# Patient Record
Sex: Female | Born: 2000 | Race: White | Hispanic: No | Marital: Single | Smoking: Former smoker
Health system: Southern US, Community
[De-identification: ages and names within clinical notes are randomized; demographics above are authoritative.]

## PROBLEM LIST (undated history)

## (undated) DIAGNOSIS — J45909 Unspecified asthma, uncomplicated: Secondary | ICD-10-CM

## (undated) HISTORY — PX: BREAST LUMPECTOMY: SHX2

## (undated) HISTORY — PX: WISDOM TOOTH EXTRACTION: SHX21

---

## 2018-02-28 ENCOUNTER — Ambulatory Visit (HOSPITAL_COMMUNITY)
Admission: EM | Admit: 2018-02-28 | Discharge: 2018-02-28 | Disposition: A | Discharge status: Home / Self Care | Payer: Managed Care, Other (non HMO) | Attending: Family Medicine | Admitting: Family Medicine

## 2018-02-28 ENCOUNTER — Other Ambulatory Visit: Payer: Self-pay

## 2018-02-28 ENCOUNTER — Ambulatory Visit (INDEPENDENT_AMBULATORY_CARE_PROVIDER_SITE_OTHER): Payer: Managed Care, Other (non HMO)

## 2018-02-28 ENCOUNTER — Encounter (HOSPITAL_COMMUNITY): Payer: Self-pay | Admitting: Family Medicine

## 2018-02-28 DIAGNOSIS — M25532 Pain in left wrist: Secondary | ICD-10-CM

## 2018-02-28 HISTORY — DX: Unspecified asthma, uncomplicated: J45.909

## 2018-02-28 NOTE — ED Triage Notes (Signed)
Pt states she had an IV placed in her left wrist one month ago.  Upon receiving medication for nausea she had pain and swelling above the IV site.  Pt states it took a few weeks to feel better. Then she started having more pain and swelling about a week ago.  She has a small nodule at the IV site and some swelling above the site.

## 2018-02-28 NOTE — Discharge Instructions (Addendum)
Your x-ray was negative. I would like for you to follow-up with a hand specialist for further evaluation. Tylenol or ibuprofen for pain. We can try a wrist splint to see if this helps. Make sure you ice the area

## 2018-02-28 NOTE — ED Provider Notes (Signed)
[facility]    CSN: [id number] Arrival date & time: 02/28/18  1551     History   Chief Complaint Chief Complaint  Patient presents with  . Arm Problem    left    HPI Angela Mckenzie is a 17 y.o. female.   Is a 17 year old female that presents with left wrist pain and swelling x1 month.  This started after she was hospitalized and had an IV in the left posterior wrist.  Reports that the medication that was pushed through the IV for nausea caused severe burning around the site where the IV was and felt like her arm was on fire.  Since she was discharged she has had a persistent nodule with swelling in the wrist extending up the arm.  Wrist is very tender to touch.  No erythema or increased warmth.   ROS per HPI      Past Medical History:  Diagnosis Date  . Asthma     There are no active problems to display for this patient.   Past Surgical History:  Procedure Laterality Date  . BREAST LUMPECTOMY    . WISDOM TOOTH EXTRACTION      OB History   None      Home Medications    Prior to Admission medications   Medication Sig Start Date End Date Taking? Authorizing Provider  JUNEL FE 1.5/30 1.5-30 MG-MCG tablet Take 1 tablet by mouth daily. 01/30/18  Yes [provider]  montelukast (SINGULAIR) 10 MG tablet  02/26/18  Yes [provider]  EPINEPHrine 0.3 mg/0.3 mL IJ SOAJ injection PLEASE SEE ATTACHED FOR DETAILED DIRECTIONS 01/31/18   [provider]  PROAIR HFA 108 [phone] Base) MCG/ACT inhaler  01/31/18   [provider]  valACYclovir (VALTREX) 500 MG tablet Take 500 mg by mouth daily. 12/05/17   [provider]    Family History History reviewed. No pertinent family history.  Social History Social History   Tobacco Use  . Smoking status: Current Every Day Smoker    Packs/day: 0.25    Types: Cigarettes  . Smokeless tobacco: Never Used  Substance Use Topics  . Alcohol use: Not Currently    Frequency: Never  .  Drug use: Yes    Types: Marijuana      Allergies   Patient has no known allergies.   Review of Systems Review of Systems  Constitutional: Negative for activity change, appetite change and fever.  Musculoskeletal: Positive for joint swelling.     Physical Exam Triage Vital Signs ED Triage Vitals  Enc Vitals Group     BP 02/28/18 1635 136/85     Pulse Rate 02/28/18 1635 76     Resp 02/28/18 1635 16     Temp 02/28/18 1635 98 F (36.7 C)     Temp Source 02/28/18 1635 Oral     SpO2 02/28/18 1635 98 %     Weight --      Height --      Head Circumference --      Peak Flow --      Pain Score 02/28/18 1652 7     Pain Loc --      Pain Edu? --      Excl. in GC? --    No data found.  Updated Vital Signs BP 136/85 (BP Location: Right Arm)   Pulse 76   Temp 98 F (36.7 C) (Oral)   Resp 16   LMP 01/31/2018 (Approximate)   SpO2 98%  Visual Acuity Right Eye Distance:   Left Eye Distance:   Bilateral Distance:    Right Eye Near:   Left Eye Near:    Bilateral Near:     Physical Exam  Constitutional: She is oriented to person, place, and time. She appears well-developed and well-nourished.  Very pleasant. Non toxic or ill appearing.     HENT:  Head: Normocephalic and atraumatic.  Eyes: Conjunctivae are normal.  Neck: Normal range of motion.  Pulmonary/Chest: Effort normal.  Musculoskeletal: Normal range of motion.  Tenderness to palpation and swelling to the left posterior forearm and wrist. Nodule palpated in center of left wrist.  No erythema or increased warmth. Sensation and pulse intact.   Neurological: She is alert and oriented to person, place, and time.  Skin: Skin is warm and dry. No rash noted. No erythema. No pallor.  Psychiatric: She has a normal mood and affect.  Nursing note and vitals reviewed.    UC Treatments / Results  Labs (all labs ordered are listed, but only abnormal results are displayed) Labs Reviewed - No data to  display  EKG None  Radiology Dg Wrist Complete Left  Result Date: 02/28/2018 CLINICAL DATA:  Wrist pain and swelling EXAM: LEFT WRIST - COMPLETE 3+ VIEW COMPARISON:  None. FINDINGS: No fracture or malalignment. No periostitis or bone destruction. No radiopaque foreign body in the soft tissues IMPRESSION: No acute osseous abnormality Electronically Signed   By: Jasmine Pang M.D.   On: 02/28/2018 17:52    Procedures Procedures (including critical care time)  Medications Ordered in UC Medications - No data to display  Initial Impression / Assessment and Plan / UC Course  I have reviewed the triage vital signs and the nursing notes.  Pertinent labs & imaging results that were available during my care of the patient were reviewed by me and considered in my medical decision making (see chart for details).     X ray negative for foreign body or bony abnormality.  Wrist splint, ice elevate and ibuprofen for the pain and inflammation.  Instructed to follow up with hand surgery for further evaluation in the next week if symptoms still persist.  Patient understanding and agreeable to plan Final Clinical Impressions(s) / UC Diagnoses   Final diagnoses:  Left wrist pain     Discharge Instructions     Your x-ray was negative. I would like for you to follow-up with a hand specialist for further evaluation. Tylenol or ibuprofen for pain. We can try a wrist splint to see if this helps. Make sure you ice the area    ED Prescriptions    None     Controlled Substance Prescriptions [place] Controlled Substance Registry consulted? Not Applicable   Janace Aris, NP 03/01/18 [phone]

## 2018-06-29 ENCOUNTER — Other Ambulatory Visit: Payer: Self-pay

## 2018-06-29 ENCOUNTER — Encounter (HOSPITAL_COMMUNITY): Payer: Self-pay | Admitting: Emergency Medicine

## 2018-06-29 ENCOUNTER — Emergency Department (HOSPITAL_COMMUNITY)
Admission: EM | Admit: 2018-06-29 | Discharge: 2018-06-30 | Disposition: A | Discharge status: Home / Self Care | Payer: Managed Care, Other (non HMO) | Attending: Emergency Medicine | Admitting: Emergency Medicine

## 2018-06-29 DIAGNOSIS — F17210 Nicotine dependence, cigarettes, uncomplicated: Secondary | ICD-10-CM | POA: Diagnosis not present

## 2018-06-29 DIAGNOSIS — Z79899 Other long term (current) drug therapy: Secondary | ICD-10-CM | POA: Diagnosis not present

## 2018-06-29 DIAGNOSIS — N72 Inflammatory disease of cervix uteri: Secondary | ICD-10-CM | POA: Insufficient documentation

## 2018-06-29 DIAGNOSIS — J45909 Unspecified asthma, uncomplicated: Secondary | ICD-10-CM | POA: Diagnosis not present

## 2018-06-29 DIAGNOSIS — R1030 Lower abdominal pain, unspecified: Secondary | ICD-10-CM | POA: Diagnosis present

## 2018-06-29 LAB — URINALYSIS, ROUTINE W REFLEX MICROSCOPIC
BILIRUBIN URINE: NEGATIVE
Glucose, UA: NEGATIVE mg/dL
HGB URINE DIPSTICK: NEGATIVE
Ketones, ur: 20 mg/dL — AB
NITRITE: NEGATIVE
Protein, ur: NEGATIVE mg/dL
SPECIFIC GRAVITY, URINE: 1.019 (ref 1.005–1.030)
pH: 6 (ref 5.0–8.0)

## 2018-06-29 LAB — CBC
HEMATOCRIT: 42.6 % (ref 36.0–46.0)
HEMOGLOBIN: 13.7 g/dL (ref 12.0–15.0)
MCH: 28.9 pg (ref 26.0–34.0)
MCHC: 32.2 g/dL (ref 30.0–36.0)
MCV: 89.9 fL (ref 80.0–100.0)
NRBC: 0 % (ref 0.0–0.2)
Platelets: 360 10*3/uL (ref 150–400)
RBC: 4.74 MIL/uL (ref 3.87–5.11)
RDW: 13 % (ref 11.5–15.5)
WBC: 12 10*3/uL — ABNORMAL HIGH (ref 4.0–10.5)

## 2018-06-29 LAB — COMPREHENSIVE METABOLIC PANEL
ALT: 16 U/L (ref 0–44)
ANION GAP: 11 (ref 5–15)
AST: 17 U/L (ref 15–41)
Albumin: 4.1 g/dL (ref 3.5–5.0)
Alkaline Phosphatase: 56 U/L (ref 38–126)
BUN: 9 mg/dL (ref 6–20)
CHLORIDE: 107 mmol/L (ref 98–111)
CO2: 22 mmol/L (ref 22–32)
Calcium: 9.5 mg/dL (ref 8.9–10.3)
Creatinine, Ser: 0.58 mg/dL (ref 0.44–1.00)
Glucose, Bld: 80 mg/dL (ref 70–99)
POTASSIUM: 4.1 mmol/L (ref 3.5–5.1)
Sodium: 140 mmol/L (ref 135–145)
Total Bilirubin: 0.7 mg/dL (ref 0.3–1.2)
Total Protein: 7.4 g/dL (ref 6.5–8.1)

## 2018-06-29 LAB — LIPASE, BLOOD: Lipase: 28 U/L (ref 11–51)

## 2018-06-29 LAB — I-STAT BETA HCG BLOOD, ED (MC, WL, AP ONLY)

## 2018-06-29 MED ORDER — ONDANSETRON 4 MG PO TBDP
4.0000 mg | ORAL_TABLET | Freq: Once | ORAL | Status: AC | PRN
  Administered 2018-06-29: 4 mg via ORAL
  Filled 2018-06-29: qty 1

## 2018-06-29 NOTE — ED Triage Notes (Signed)
Onset one day ago developed RLQ abdominal pain with nausea and emesis x 2 today. Seen at Urgent Care sent patient to ED for evaluation.

## 2018-06-30 LAB — WET PREP, GENITAL
Sperm: NONE SEEN
Trich, Wet Prep: NONE SEEN
Yeast Wet Prep HPF POC: NONE SEEN

## 2018-06-30 MED ORDER — CEFTRIAXONE SODIUM 250 MG IJ SOLR
250.0000 mg | Freq: Once | INTRAMUSCULAR | Status: AC
  Administered 2018-06-30: 250 mg via INTRAMUSCULAR
  Filled 2018-06-30: qty 250

## 2018-06-30 MED ORDER — KETOROLAC TROMETHAMINE 15 MG/ML IJ SOLN
15.0000 mg | Freq: Once | INTRAMUSCULAR | Status: AC
  Administered 2018-06-30: 15 mg via INTRAVENOUS
  Filled 2018-06-30: qty 1

## 2018-06-30 MED ORDER — LIDOCAINE HCL (PF) 1 % IJ SOLN
0.9000 mL | Freq: Once | INTRAMUSCULAR | Status: AC
  Administered 2018-06-30: 0.9 mL
  Filled 2018-06-30: qty 5

## 2018-06-30 MED ORDER — DOXYCYCLINE HYCLATE 100 MG PO CAPS: 100.0000 mg | ORAL_CAPSULE | Freq: Two times a day (BID) | ORAL | 0 refills | Status: DC

## 2018-06-30 MED ORDER — METRONIDAZOLE 500 MG PO TABS
2000.0000 mg | ORAL_TABLET | Freq: Once | ORAL | Status: AC
  Administered 2018-06-30: 2000 mg via ORAL
  Filled 2018-06-30: qty 4

## 2018-06-30 MED ORDER — ONDANSETRON 4 MG PO TBDP: 4.0000 mg | ORAL_TABLET | Freq: Three times a day (TID) | ORAL | 0 refills | Status: AC | PRN

## 2018-06-30 MED ORDER — ACETAMINOPHEN 500 MG PO TABS
1000.0000 mg | ORAL_TABLET | Freq: Once | ORAL | Status: AC
  Administered 2018-06-30: 1000 mg via ORAL
  Filled 2018-06-30: qty 2

## 2018-06-30 MED ORDER — AZITHROMYCIN 250 MG PO TABS
1000.0000 mg | ORAL_TABLET | Freq: Once | ORAL | Status: AC
  Administered 2018-06-30: 1000 mg via ORAL
  Filled 2018-06-30: qty 4

## 2018-06-30 NOTE — ED Provider Notes (Signed)
[facility] EMERGENCY DEPARTMENT Provider Note   CSN: [id number] Arrival date & time: 06/29/18  1944     History   Chief Complaint Chief Complaint  Patient presents with  . Abdominal Pain    HPI Angela Mckenzie is a 18 y.o. female.  18 yo F with a chief complaint of lower abdominal pain.  This been going on for the past couple days.  She describes it as sharp and severe has had some episodes of vomiting.  She denies vaginal bleeding or discharge denies trauma.  Denies fevers or chills.  She has pressure when she urinates.  Denies other urinary symptoms.  She missed her menstrual cycle last month and has taken 2 pregnancy test that were negative.  The history is provided by the patient.  Abdominal Pain  Pain location:  Suprapubic Pain quality: aching, cramping and sharp   Pain radiates to:  Does not radiate Pain severity:  Severe Onset quality:  Gradual Duration:  2 days Timing:  Constant Progression:  Worsening Chronicity:  New Relieved by:  NSAIDs Worsened by:  Nothing Ineffective treatments:  None tried Associated symptoms: vomiting   Associated symptoms: no chest pain, no chills, no dysuria, no fever, no nausea and no shortness of breath     Past Medical History:  Diagnosis Date  . Asthma     There are no active problems to display for this patient.   Past Surgical History:  Procedure Laterality Date  . BREAST LUMPECTOMY    . WISDOM TOOTH EXTRACTION       OB History   No obstetric history on file.      Home Medications    Prior to Admission medications   Medication Sig Start Date End Date Taking? Authorizing Provider  doxycycline (VIBRAMYCIN) 100 MG capsule Take 1 capsule (100 mg total) by mouth 2 (two) times daily. 06/30/18   [name], DO  EPINEPHrine 0.3 mg/0.3 mL IJ SOAJ injection PLEASE SEE ATTACHED FOR DETAILED DIRECTIONS 01/31/18   [provider]  JUNEL FE 1.5/30 1.5-30 MG-MCG tablet Take 1 tablet by mouth daily.  01/30/18   [provider]  montelukast (SINGULAIR) 10 MG tablet  02/26/18   [provider]  ondansetron (ZOFRAN ODT) 4 MG disintegrating tablet Take 1 tablet (4 mg total) by mouth every 8 (eight) hours as needed for nausea or vomiting. 06/30/18   [name], DO  PROAIR HFA 108 [phone] Base) MCG/ACT inhaler  01/31/18   [provider]  valACYclovir (VALTREX) 500 MG tablet Take 500 mg by mouth daily. 12/05/17   [provider]    Family History No family history on file.  Social History Social History   Tobacco Use  . Smoking status: Current Every Day Smoker    Packs/day: 0.25    Types: Cigarettes  . Smokeless tobacco: Never Used  Substance Use Topics  . Alcohol use: Not Currently    Frequency: Never  . Drug use: Yes    Types: Marijuana     Allergies   Patient has no known allergies.   Review of Systems Review of Systems  Constitutional: Negative for chills and fever.  HENT: Negative for congestion and rhinorrhea.   Eyes: Negative for redness and visual disturbance.  Respiratory: Negative for shortness of breath and wheezing.   Cardiovascular: Negative for chest pain and palpitations.  Gastrointestinal: Positive for abdominal pain and vomiting. Negative for nausea.  Genitourinary: Negative for dysuria and urgency.  Musculoskeletal: Negative for arthralgias and myalgias.  Skin: Negative for pallor and wound.  Neurological: Negative for dizziness and headaches.     Physical Exam Updated Vital Signs BP 130/78   Pulse 87   Temp 98.8 F (37.1 C) (Oral)   Resp 16   Ht 5\' 6"  (1.676 m)   Wt 61.2 kg   LMP 05/20/2018   SpO2 99%   BMI 21.79 kg/m   Physical Exam Vitals signs and nursing note reviewed.  Constitutional:      General: She is not in acute distress.    Appearance: She is well-developed. She is not diaphoretic.  HENT:     Head: Normocephalic and atraumatic.  Eyes:     Pupils: Pupils are equal, round, and reactive to light.    Neck:     Musculoskeletal: Normal range of motion and neck supple.  Cardiovascular:     Rate and Rhythm: Normal rate and regular rhythm.     Heart sounds: No murmur. No friction rub. No gallop.   Pulmonary:     Effort: Pulmonary effort is normal.     Breath sounds: No wheezing or rales.  Abdominal:     General: There is no distension.     Palpations: Abdomen is soft.     Tenderness: There is abdominal tenderness.     Comments: Diffuse tenderness to the very low abdomen.  Genitourinary:    Cervix: Cervical motion tenderness present.     Comments: No noted adnexal tenderness, purulent drainage with erythematous cervix. Musculoskeletal:        General: No tenderness.  Skin:    General: Skin is warm and dry.  Neurological:     Mental Status: She is alert and oriented to person, place, and time.  Psychiatric:        Behavior: Behavior normal.      ED Treatments / Results  Labs (all labs ordered are listed, but only abnormal results are displayed) Labs Reviewed  WET PREP, GENITAL - Abnormal; Notable for the following components:      Result Value   Clue Cells Wet Prep HPF POC PRESENT (*)    WBC, Wet Prep HPF POC MANY (*)    All other components within normal limits  CBC - Abnormal; Notable for the following components:   WBC 12.0 (*)    All other components within normal limits  URINALYSIS, ROUTINE W REFLEX MICROSCOPIC - Abnormal; Notable for the following components:   APPearance HAZY (*)    Ketones, ur 20 (*)    Leukocytes, UA TRACE (*)    Bacteria, UA RARE (*)    All other components within normal limits  LIPASE, BLOOD  COMPREHENSIVE METABOLIC PANEL  I-STAT BETA HCG BLOOD, ED ([facility], [facility], [facility] ONLY)  GC/CHLAMYDIA PROBE AMP ([place]) NOT AT [facility]    EKG None  Radiology No results found.  Procedures Procedures (including critical care time)  Medications Ordered in ED Medications  metroNIDAZOLE (FLAGYL) tablet 2,000 mg (has no administration in time range)   ondansetron (ZOFRAN-ODT) disintegrating tablet 4 mg (4 mg Oral Given 06/29/18 2028)  ketorolac (TORADOL) 15 MG/ML injection 15 mg (15 mg Intravenous Given 06/30/18 0138)  acetaminophen (TYLENOL) tablet 1,000 mg (1,000 mg Oral Given 06/30/18 0138)  cefTRIAXone (ROCEPHIN) injection 250 mg (250 mg Intramuscular Given 06/30/18 0318)  azithromycin (ZITHROMAX) tablet 1,000 mg (1,000 mg Oral Given 06/30/18 0318)  lidocaine (PF) (XYLOCAINE) 1 % injection 0.9 mL (0.9 mLs Other Given 06/30/18 0318)     Initial Impression / Assessment and Plan / ED  Course  I have reviewed the triage vital signs and the nursing notes.  Pertinent labs & imaging results that were available during my care of the patient were reviewed by me and considered in my medical decision making (see chart for details).     18 yo F with a chief complaint of abdominal pain.  Most likely this is pelvic in etiology based on the location of her pain on abdominal exam.  Will perform a pelvic.  Pelvic exam with CMT, purulent drainage.   We will treat as PID.  PCP follow-up.  3:38 AM:  I have discussed the diagnosis/risks/treatment options with the patient and believe the pt to be eligible for discharge home to follow-up with PCP, GYN. We also discussed returning to the ED immediately if new or worsening sx occur. We discussed the sx which are most concerning (e.g., sudden worsening pain, fever, inability to tolerate by mouth) that necessitate immediate return. Medications administered to the patient during their visit and any new prescriptions provided to the patient are listed below.  Medications given during this visit Medications  metroNIDAZOLE (FLAGYL) tablet 2,000 mg (has no administration in time range)  ondansetron (ZOFRAN-ODT) disintegrating tablet 4 mg (4 mg Oral Given 06/29/18 2028)  ketorolac (TORADOL) 15 MG/ML injection 15 mg (15 mg Intravenous Given 06/30/18 0138)  acetaminophen (TYLENOL) tablet 1,000 mg (1,000 mg Oral Given  06/30/18 0138)  cefTRIAXone (ROCEPHIN) injection 250 mg (250 mg Intramuscular Given 06/30/18 0318)  azithromycin (ZITHROMAX) tablet 1,000 mg (1,000 mg Oral Given 06/30/18 0318)  lidocaine (PF) (XYLOCAINE) 1 % injection 0.9 mL (0.9 mLs Other Given 06/30/18 0318)      The patient appears reasonably screen and/or stabilized for discharge and I doubt any other medical condition or other [facility] requiring further screening, evaluation, or treatment in the ED at this time prior to discharge.    Final Clinical Impressions(s) / ED Diagnoses   Final diagnoses:  Cervicitis    ED Discharge Orders         Ordered    doxycycline (VIBRAMYCIN) 100 MG capsule  2 times daily     06/30/18 0334    ondansetron (ZOFRAN ODT) 4 MG disintegrating tablet  Every 8 hours PRN     06/30/18 0335           Melene PlanFloyd, Pauleen Goleman, DO 06/30/18 [phone]

## 2018-06-30 NOTE — Discharge Instructions (Addendum)
No sexual activity until you have finished your antibiotics.  Return for fever, worsening abdominal pain.

## 2018-07-02 LAB — GC/CHLAMYDIA PROBE AMP (~~LOC~~) NOT AT ARMC
CHLAMYDIA, DNA PROBE: NEGATIVE
NEISSERIA GONORRHEA: NEGATIVE

## 2020-07-08 ENCOUNTER — Other Ambulatory Visit: Payer: Managed Care, Other (non HMO)

## 2020-07-08 DIAGNOSIS — Z20822 Contact with and (suspected) exposure to covid-19: Secondary | ICD-10-CM

## 2020-07-10 LAB — SARS-COV-2, NAA 2 DAY TAT

## 2020-07-10 LAB — NOVEL CORONAVIRUS, NAA: SARS-CoV-2, NAA: NOT DETECTED

## 2020-08-31 ENCOUNTER — Emergency Department
Admission: EM | Admit: 2020-08-31 | Discharge: 2020-08-31 | Disposition: A | Discharge status: Home / Self Care | Payer: Managed Care, Other (non HMO) | Attending: Emergency Medicine | Admitting: Emergency Medicine

## 2020-08-31 ENCOUNTER — Other Ambulatory Visit: Payer: Self-pay

## 2020-08-31 ENCOUNTER — Emergency Department: Payer: Managed Care, Other (non HMO)

## 2020-08-31 DIAGNOSIS — J45901 Unspecified asthma with (acute) exacerbation: Secondary | ICD-10-CM | POA: Diagnosis not present

## 2020-08-31 DIAGNOSIS — F17210 Nicotine dependence, cigarettes, uncomplicated: Secondary | ICD-10-CM | POA: Diagnosis not present

## 2020-08-31 DIAGNOSIS — Z79899 Other long term (current) drug therapy: Secondary | ICD-10-CM | POA: Diagnosis not present

## 2020-08-31 DIAGNOSIS — R0602 Shortness of breath: Secondary | ICD-10-CM | POA: Diagnosis present

## 2020-08-31 LAB — CBC
HCT: 41.8 % (ref 36.0–46.0)
Hemoglobin: 13.7 g/dL (ref 12.0–15.0)
MCH: 29 pg (ref 26.0–34.0)
MCHC: 32.8 g/dL (ref 30.0–36.0)
MCV: 88.6 fL (ref 80.0–100.0)
Platelets: 359 10*3/uL (ref 150–400)
RBC: 4.72 MIL/uL (ref 3.87–5.11)
RDW: 12.7 % (ref 11.5–15.5)
WBC: 12.2 10*3/uL — ABNORMAL HIGH (ref 4.0–10.5)
nRBC: 0 % (ref 0.0–0.2)

## 2020-08-31 LAB — TROPONIN I (HIGH SENSITIVITY): Troponin I (High Sensitivity): 2 ng/L (ref <18)

## 2020-08-31 LAB — COMPREHENSIVE METABOLIC PANEL
ALT: 14 U/L (ref 0–44)
AST: 19 U/L (ref 15–41)
Albumin: 4.3 g/dL (ref 3.5–5.0)
Alkaline Phosphatase: 47 U/L (ref 38–126)
Anion gap: 8 (ref 5–15)
BUN: 9 mg/dL (ref 6–20)
CO2: 21 mmol/L — ABNORMAL LOW (ref 22–32)
Calcium: 9 mg/dL (ref 8.9–10.3)
Chloride: 113 mmol/L — ABNORMAL HIGH (ref 98–111)
Creatinine, Ser: 0.54 mg/dL (ref 0.44–1.00)
GFR, Estimated: >60 mL/min (ref >60)
Glucose, Bld: 97 mg/dL (ref 70–99)
Potassium: 3.5 mmol/L (ref 3.5–5.1)
Sodium: 142 mmol/L (ref 135–145)
Total Bilirubin: 0.6 mg/dL (ref 0.3–1.2)
Total Protein: 7.7 g/dL (ref 6.5–8.1)

## 2020-08-31 MED ORDER — KETOROLAC TROMETHAMINE 30 MG/ML IJ SOLN
30.0000 mg | Freq: Once | INTRAMUSCULAR | Status: AC
  Administered 2020-08-31: 30 mg via INTRAVENOUS
  Filled 2020-08-31: qty 1

## 2020-08-31 MED ORDER — IPRATROPIUM-ALBUTEROL 0.5-2.5 (3) MG/3ML IN SOLN
RESPIRATORY_TRACT | Status: AC
  Filled 2020-08-31: qty 3

## 2020-08-31 MED ORDER — IPRATROPIUM-ALBUTEROL 0.5-2.5 (3) MG/3ML IN SOLN
3.0000 mL | Freq: Once | RESPIRATORY_TRACT | Status: AC
  Administered 2020-08-31: 3 mL via RESPIRATORY_TRACT
  Filled 2020-08-31: qty 3

## 2020-08-31 MED ORDER — IPRATROPIUM-ALBUTEROL 0.5-2.5 (3) MG/3ML IN SOLN: 3.0000 mL | Freq: Four times a day (QID) | RESPIRATORY_TRACT | 1 refills | Status: DC | PRN

## 2020-08-31 MED ORDER — PREDNISONE 50 MG PO TABS: 50.0000 mg | ORAL_TABLET | Freq: Every day | ORAL | 0 refills | Status: DC

## 2020-08-31 MED ORDER — IPRATROPIUM-ALBUTEROL 0.5-2.5 (3) MG/3ML IN SOLN
3.0000 mL | Freq: Once | RESPIRATORY_TRACT | Status: AC
  Administered 2020-08-31: 3 mL via RESPIRATORY_TRACT

## 2020-08-31 MED ORDER — METHYLPREDNISOLONE SODIUM SUCC 125 MG IJ SOLR
125.0000 mg | Freq: Once | INTRAMUSCULAR | Status: AC
  Administered 2020-08-31: 125 mg via INTRAVENOUS
  Filled 2020-08-31: qty 2

## 2020-08-31 NOTE — ED Provider Notes (Signed)
[facility] Emergency Department Provider Note   ____________________________________________    I have reviewed the triage vital signs and the nursing notes.   HISTORY  Chief Complaint Shortness of Breath     HPI Angela Mckenzie is a 20 y.o. female with history of asthma who presents with complaints of shortness of breath.  Patient reports breathing became much worse this morning she has tightness in her chest and also describes a mild headache.  She is having significant difficulty breathing, history limited by shortness of breath  Past Medical History:  Diagnosis Date  . Asthma     There are no problems to display for this patient.   Past Surgical History:  Procedure Laterality Date  . BREAST LUMPECTOMY    . WISDOM TOOTH EXTRACTION      Prior to Admission medications   Medication Sig Start Date End Date Taking? Authorizing Provider  ipratropium-albuterol (DUONEB) 0.5-2.5 (3) MG/3ML SOLN Take 3 mLs by nebulization every 6 (six) hours as needed. 08/31/20  Yes [name], MD  predniSONE (DELTASONE) 50 MG tablet Take 1 tablet (50 mg total) by mouth daily with breakfast. 08/31/20  Yes [name], MD  doxycycline (VIBRAMYCIN) 100 MG capsule Take 1 capsule (100 mg total) by mouth 2 (two) times daily. 06/30/18   [name], DO  EPINEPHrine 0.3 mg/0.3 mL IJ SOAJ injection PLEASE SEE ATTACHED FOR DETAILED DIRECTIONS 01/31/18   [provider]  JUNEL FE 1.5/30 1.5-30 MG-MCG tablet Take 1 tablet by mouth daily. 01/30/18   [provider]  montelukast (SINGULAIR) 10 MG tablet  02/26/18   [provider]  ondansetron (ZOFRAN ODT) 4 MG disintegrating tablet Take 1 tablet (4 mg total) by mouth every 8 (eight) hours as needed for nausea or vomiting. 06/30/18   [name], DO  PROAIR HFA 108 [phone] Base) MCG/ACT inhaler  01/31/18   [provider]  valACYclovir (VALTREX) 500 MG tablet Take 500 mg by mouth daily. 12/05/17    [provider]     Allergies Patient has no known allergies.  No family history on file.  Social History Social History   Tobacco Use  . Smoking status: Current Every Day Smoker    Packs/day: 0.25    Types: Cigarettes  . Smokeless tobacco: Never Used  Vaping Use  . Vaping Use: Some days  Substance Use Topics  . Alcohol use: Not Currently  . Drug use: Yes    Types: Marijuana    Review of Systems limited by shortness of breath  Constitutional: No reported fever Eyes: No discharge ENT: Throat swelling Cardiovascular: As above Respiratory: As above Gastrointestinal: no vomiting.   Genitourinary: No dysuria reported Musculoskeletal: Negative for calf pain Skin: Negative for rash. Neurological: Positive headache, no weakness    ____________________________________________   PHYSICAL EXAM:  VITAL SIGNS: ED Triage Vitals [08/31/20 0648]  Enc Vitals Group     BP (!) 143/102     Pulse Rate (!) 126     Resp (!) 22     Temp 98.3 F (36.8 C)     Temp Source Oral     SpO2 92 %     Weight 68 kg (150 lb)     Height 1.676 m (5\' 6" )     Head Circumference      Peak Flow      Pain Score      Pain Loc      Pain Edu?      Excl. in GC?  Constitutional: Alert and oriented.  Eyes: Conjunctivae are normal.  Head: Atraumatic. Nose: No congestion/rhinnorhea. Mouth/Throat: Mucous membranes are moist.   Neck:  Painless ROM Cardiovascular: Tachycardia, regular rhythm. Grossly normal heart sounds.  Good peripheral circulation. Respiratory: Increased respiratory effort with tachypnea, pursed lip breathing, diffuse wheezing but moderate airflow Gastrointestinal: Soft and nontender. No distention.    Musculoskeletal: No lower extremity tenderness nor edema.  Warm and well perfused Neurologic:  Normal speech and language. No gross focal neurologic deficits are appreciated.  Skin:  Skin is warm, dry and intact. No rash  noted.   ____________________________________________   LABS (all labs ordered are listed, but only abnormal results are displayed)  Labs Reviewed  CBC - Abnormal; Notable for the following components:      Result Value   WBC 12.2 (*)    All other components within normal limits  COMPREHENSIVE METABOLIC PANEL - Abnormal; Notable for the following components:   Chloride 113 (*)    CO2 21 (*)    All other components within normal limits  TROPONIN I (HIGH SENSITIVITY)   ____________________________________________  EKG  ED ECG REPORT I, [name], the attending physician, personally viewed and interpreted this ECG.  Date: 08/31/2020  Rhythm: Sinus tachycardia QRS Axis: normal Intervals: normal ST/T Wave abnormalities: normal Narrative Interpretation: no evidence of acute ischemia  ____________________________________________  RADIOLOGY  Chest x-ray viewed by me, no infiltrate or effusion ____________________________________________   PROCEDURES  Procedure(s) performed: No  Procedures   Critical Care performed: No ____________________________________________   INITIAL IMPRESSION / ASSESSMENT AND PLAN / ED COURSE  Pertinent labs & imaging results that were available during my care of the patient were reviewed by me and considered in my medical decision making (see chart for details).  Patient presents with shortness of breath, diffuse wheezing on exam, tachycardic with oxygen saturations in the low 90s.  Presentation consistent with asthma exacerbation.  Patient started on duo nebs immediately, IV Solu-Medrol ordered, cardiac monitor, EKG, labs ordered  Lab work with mildly elevated white blood cell count, related to work of breathing almost certainly.  ----------------------------------------- 7:53 AM on 08/31/2020 -----------------------------------------  Patient feeling much better, better airflow on exam, still some scattered wheezing, will give 1  additional DuoNeb  ----------------------------------------- 8:55 AM on 08/31/2020 -----------------------------------------  Wheezing has resolved, patient feels well and is asking to leave, appropriate for discharge at this time, strict return precautions discussed, medications refilled    ____________________________________________   FINAL CLINICAL IMPRESSION(S) / ED DIAGNOSES  Final diagnoses:  Exacerbation of asthma, unspecified asthma severity, unspecified whether persistent        Note:  This document was prepared using Dragon voice recognition software and may include unintentional dictation errors.   [name], MD 08/31/20 [phone]

## 2020-08-31 NOTE — ED Notes (Signed)
Esign not working pt verbalized discharge instructions and has no questions at this time 

## 2020-08-31 NOTE — ED Triage Notes (Addendum)
Pt states she is having an asthma attack. Pt tearful, states she is shob. Pt tool albuterol 10 min pta. Pursed lip breathing noted. Pt able to speak in short chopped sentences.

## 2020-08-31 NOTE — ED Notes (Signed)
X-ray at bedside

## 2020-09-07 ENCOUNTER — Emergency Department
Admission: EM | Admit: 2020-09-07 | Discharge: 2020-09-07 | Disposition: A | Discharge status: Home / Self Care | Payer: Managed Care, Other (non HMO) | Attending: Emergency Medicine | Admitting: Emergency Medicine

## 2020-09-07 ENCOUNTER — Emergency Department: Payer: Managed Care, Other (non HMO)

## 2020-09-07 ENCOUNTER — Other Ambulatory Visit: Payer: Self-pay

## 2020-09-07 DIAGNOSIS — J45901 Unspecified asthma with (acute) exacerbation: Secondary | ICD-10-CM | POA: Insufficient documentation

## 2020-09-07 DIAGNOSIS — F17210 Nicotine dependence, cigarettes, uncomplicated: Secondary | ICD-10-CM | POA: Insufficient documentation

## 2020-09-07 DIAGNOSIS — R0602 Shortness of breath: Secondary | ICD-10-CM | POA: Diagnosis present

## 2020-09-07 MED ORDER — IPRATROPIUM-ALBUTEROL 0.5-2.5 (3) MG/3ML IN SOLN
3.0000 mL | Freq: Once | RESPIRATORY_TRACT | Status: AC
  Administered 2020-09-07: 3 mL via RESPIRATORY_TRACT

## 2020-09-07 MED ORDER — IPRATROPIUM-ALBUTEROL 0.5-2.5 (3) MG/3ML IN SOLN
3.0000 mL | Freq: Once | RESPIRATORY_TRACT | Status: AC
  Administered 2020-09-07: 3 mL via RESPIRATORY_TRACT
  Filled 2020-09-07: qty 9

## 2020-09-07 MED ORDER — ALBUTEROL SULFATE (2.5 MG/3ML) 0.083% IN NEBU
2.5000 mg | INHALATION_SOLUTION | Freq: Four times a day (QID) | RESPIRATORY_TRACT | 12 refills | Status: DC | PRN
Start: 2020-09-07 — End: 2020-09-07

## 2020-09-07 MED ORDER — METHYLPREDNISOLONE SODIUM SUCC 125 MG IJ SOLR
125.0000 mg | Freq: Once | INTRAMUSCULAR | Status: AC
  Administered 2020-09-07: 125 mg via INTRAVENOUS
  Filled 2020-09-07: qty 2

## 2020-09-07 MED ORDER — KETOROLAC TROMETHAMINE 30 MG/ML IJ SOLN
30.0000 mg | Freq: Once | INTRAMUSCULAR | Status: AC
  Administered 2020-09-07: 30 mg via INTRAVENOUS
  Filled 2020-09-07: qty 1

## 2020-09-07 MED ORDER — ALBUTEROL SULFATE (2.5 MG/3ML) 0.083% IN NEBU
2.5000 mg | INHALATION_SOLUTION | Freq: Four times a day (QID) | RESPIRATORY_TRACT | 12 refills | Status: DC | PRN
Start: 2020-09-07 — End: 2023-06-22

## 2020-09-07 NOTE — ED Provider Notes (Signed)
[facility] Emergency Department Provider Note   ____________________________________________    I have reviewed the triage vital signs and the nursing notes.   HISTORY  Chief Complaint Asthma     HPI Angela Mckenzie is a 20 y.o. female with history of asthma who presents with complaints of shortness of breath.  Patient reports wheezing, apparently was unable to fill DuoNeb prescription provided last week.  She only took 1 prednisone because of difficulties obtaining prescription.  She complains of tightness in her chest, difficulty breathing, particularly bad this morning.  No fevers or chills.  Past Medical History:  Diagnosis Date  . Asthma     There are no problems to display for this patient.   Past Surgical History:  Procedure Laterality Date  . BREAST LUMPECTOMY    . WISDOM TOOTH EXTRACTION      Prior to Admission medications   Medication Sig Start Date End Date Taking? Authorizing Provider  albuterol (PROVENTIL) (2.5 MG/3ML) 0.083% nebulizer solution Take 3 mLs (2.5 mg total) by nebulization every 6 (six) hours as needed for wheezing or shortness of breath. 09/07/20  Yes [name], MD  doxycycline (VIBRAMYCIN) 100 MG capsule Take 1 capsule (100 mg total) by mouth 2 (two) times daily. 06/30/18   [name], DO  EPINEPHrine 0.3 mg/0.3 mL IJ SOAJ injection PLEASE SEE ATTACHED FOR DETAILED DIRECTIONS 01/31/18   [provider]  ipratropium-albuterol (DUONEB) 0.5-2.5 (3) MG/3ML SOLN Take 3 mLs by nebulization every 6 (six) hours as needed. 08/31/20   [name], MD  JUNEL FE 1.5/30 1.5-30 MG-MCG tablet Take 1 tablet by mouth daily. 01/30/18   [provider]  montelukast (SINGULAIR) 10 MG tablet  02/26/18   [provider]  ondansetron (ZOFRAN ODT) 4 MG disintegrating tablet Take 1 tablet (4 mg total) by mouth every 8 (eight) hours as needed for nausea or vomiting. 06/30/18   [name], DO  predniSONE (DELTASONE) 50  MG tablet Take 1 tablet (50 mg total) by mouth daily with breakfast. 08/31/20   [name], MD  valACYclovir (VALTREX) 500 MG tablet Take 500 mg by mouth daily. 12/05/17   [provider]     Allergies Patient has no known allergies.  No family history on file.  Social History Social History   Tobacco Use  . Smoking status: Current Every Day Smoker    Packs/day: 0.25    Types: Cigarettes  . Smokeless tobacco: Never Used  Vaping Use  . Vaping Use: Some days  Substance Use Topics  . Alcohol use: Not Currently  . Drug use: Yes    Types: Marijuana    Review of Systems  Constitutional: No fever/chills Eyes: No visual changes.  ENT: No sore throat. Cardiovascular: Chest tightness Respiratory: Positive shortness of breath Gastrointestinal: No abdominal pain.  No nausea, no vomiting.   Genitourinary: Negative for dysuria. Musculoskeletal: Negative for back pain. Skin: Negative for rash. Neurological: Mild headache   ____________________________________________   PHYSICAL EXAM:  VITAL SIGNS: ED Triage Vitals  Enc Vitals Group     BP 09/07/20 0724 120/75     Pulse Rate 09/07/20 0724 (!) 135     Resp 09/07/20 0724 20     Temp 09/07/20 0724 98.2 F (36.8 C)     Temp Source 09/07/20 0724 Oral     SpO2 09/07/20 0724 96 %     Weight 09/07/20 0727 68 kg (150 lb)     Height 09/07/20 0727 1.676 m (5\' 6" )  Head Circumference --      Peak Flow --      Pain Score 09/07/20 0727 7     Pain Loc --      Pain Edu? --      Excl. in GC? --     Constitutional: Alert and oriented.   Nose: No congestion/rhinnorhea. Mouth/Throat: Mucous membranes are moist.   Neck:  Painless ROM Cardiovascular: Tachycardia. Grossly normal heart sounds.  Good peripheral circulation. Respiratory: Increased respiratory effort with tachypnea, diffuse wheezing Gastrointestinal: Soft and nontender. No distention.  No CVA tenderness.  Musculoskeletal: No lower extremity tenderness nor  edema.  Warm and well perfused Neurologic:  Normal speech and language. No gross focal neurologic deficits are appreciated.  Skin:  Skin is warm, dry and intact. No rash noted. Psychiatric: Mood and affect are normal. Speech and behavior are normal.  ____________________________________________   LABS (all labs ordered are listed, but only abnormal results are displayed)  Labs Reviewed - No data to display ____________________________________________  EKG  ED ECG REPORT I, [name], the attending physician, personally viewed and interpreted this ECG.  Date: 09/07/2020  Rhythm: Sinus tachycardia QRS Axis: normal Intervals: normal ST/T Wave abnormalities: normal Narrative Interpretation: no evidence of acute ischemia  ____________________________________________  RADIOLOGY  Chest x-ray viewed by me, no infiltrate or effusion ____________________________________________   PROCEDURES  Procedure(s) performed: No  Procedures   Critical Care performed: No ____________________________________________   INITIAL IMPRESSION / ASSESSMENT AND PLAN / ED COURSE  Pertinent labs & imaging results that were available during my care of the patient were reviewed by me and considered in my medical decision making (see chart for details).  Patient presents with shortness of breath as noted above, wheezing on exam consistent with asthma exacerbation.  Had good response to DuoNeb's and Solu-Medrol last week, will repeat these medications, placed on the cardiac monitor.   ----------------------------------------- 9:37 AM on 09/07/2020 -----------------------------------------  Patient is feeling much better after treatments, on reexam, no wheezing, strongly encouraged her to take the prednisone as prescribed, albuterol nebulizers prescribed, strict return precautions, outpatient follow-up with PCP for long-term management recommended.     ____________________________________________   FINAL CLINICAL IMPRESSION(S) / ED DIAGNOSES  Final diagnoses:  Moderate asthma with exacerbation, unspecified whether persistent        Note:  This document was prepared using Dragon voice recognition software and may include unintentional dictation errors.   [name], MD 09/07/20 [phone]

## 2020-09-07 NOTE — ED Notes (Signed)
Pt comes with c/o asthma attack. Pt was seen here last week for same. Pt states no relief with inhalers. Pt has labored breathing and appears in distress.

## 2020-09-07 NOTE — ED Notes (Signed)
NAD noted at time of D/C. Pt ambulatory to the lobby at this time. Pt denies comments/concerns regarding D/C instructions. Verbal consent for D/C obtained at this time.

## 2020-09-07 NOTE — ED Triage Notes (Signed)
Pt states she has been having issues with asthma flare up over the past couple of weeks, was seen and treated for the same last week, has a prescription for neb but is unable to get it filled yet due to some issue with processing it. Pt is tachypnic on arrival; with pursed breathing.

## 2020-09-15 ENCOUNTER — Encounter: Payer: Self-pay | Admitting: Radiology

## 2020-09-15 ENCOUNTER — Other Ambulatory Visit: Payer: Self-pay

## 2020-09-15 ENCOUNTER — Inpatient Hospital Stay
Admission: EM | Admit: 2020-09-15 | Discharge: 2020-09-16 | DRG: 202 | Disposition: A | Discharge status: Home / Self Care | Payer: Managed Care, Other (non HMO) | Attending: Family Medicine | Admitting: Family Medicine

## 2020-09-15 ENCOUNTER — Emergency Department: Payer: Managed Care, Other (non HMO)

## 2020-09-15 DIAGNOSIS — Z8249 Family history of ischemic heart disease and other diseases of the circulatory system: Secondary | ICD-10-CM

## 2020-09-15 DIAGNOSIS — F17210 Nicotine dependence, cigarettes, uncomplicated: Secondary | ICD-10-CM | POA: Diagnosis present

## 2020-09-15 DIAGNOSIS — D72829 Elevated white blood cell count, unspecified: Secondary | ICD-10-CM | POA: Diagnosis present

## 2020-09-15 DIAGNOSIS — F410 Panic disorder [episodic paroxysmal anxiety] without agoraphobia: Secondary | ICD-10-CM | POA: Diagnosis present

## 2020-09-15 DIAGNOSIS — T380X5A Adverse effect of glucocorticoids and synthetic analogues, initial encounter: Secondary | ICD-10-CM | POA: Diagnosis present

## 2020-09-15 DIAGNOSIS — J9601 Acute respiratory failure with hypoxia: Secondary | ICD-10-CM | POA: Diagnosis present

## 2020-09-15 DIAGNOSIS — Z20822 Contact with and (suspected) exposure to covid-19: Secondary | ICD-10-CM | POA: Diagnosis present

## 2020-09-15 DIAGNOSIS — Z833 Family history of diabetes mellitus: Secondary | ICD-10-CM

## 2020-09-15 DIAGNOSIS — J4552 Severe persistent asthma with status asthmaticus: Secondary | ICD-10-CM | POA: Diagnosis present

## 2020-09-15 DIAGNOSIS — Z888 Allergy status to other drugs, medicaments and biological substances status: Secondary | ICD-10-CM | POA: Diagnosis not present

## 2020-09-15 DIAGNOSIS — R0602 Shortness of breath: Secondary | ICD-10-CM | POA: Diagnosis present

## 2020-09-15 DIAGNOSIS — J209 Acute bronchitis, unspecified: Secondary | ICD-10-CM | POA: Diagnosis present

## 2020-09-15 DIAGNOSIS — J45901 Unspecified asthma with (acute) exacerbation: Secondary | ICD-10-CM | POA: Diagnosis present

## 2020-09-15 LAB — BASIC METABOLIC PANEL
Anion gap: 7 (ref 5–15)
Anion gap: 10 (ref 5–15)
BUN: 16 mg/dL (ref 6–20)
BUN: 13 mg/dL (ref 6–20)
CO2: 27 mmol/L (ref 22–32)
CO2: 22 mmol/L (ref 22–32)
Calcium: 9.1 mg/dL (ref 8.9–10.3)
Calcium: 9 mg/dL (ref 8.9–10.3)
Chloride: 104 mmol/L (ref 98–111)
Chloride: 106 mmol/L (ref 98–111)
Creatinine, Ser: 0.63 mg/dL (ref 0.44–1.00)
Creatinine, Ser: 0.61 mg/dL (ref 0.44–1.00)
GFR, Estimated: >60 mL/min (ref >60)
GFR, Estimated: >60 mL/min (ref >60)
Glucose, Bld: 115 mg/dL — ABNORMAL HIGH (ref 70–99)
Glucose, Bld: 188 mg/dL — ABNORMAL HIGH (ref 70–99)
Potassium: 4 mmol/L (ref 3.5–5.1)
Potassium: 3.4 mmol/L — ABNORMAL LOW (ref 3.5–5.1)
Sodium: 138 mmol/L (ref 135–145)
Sodium: 138 mmol/L (ref 135–145)

## 2020-09-15 LAB — CBC
HCT: 40.6 % (ref 36.0–46.0)
Hemoglobin: 13.4 g/dL (ref 12.0–15.0)
MCH: 29.1 pg (ref 26.0–34.0)
MCHC: 33 g/dL (ref 30.0–36.0)
MCV: 88.3 fL (ref 80.0–100.0)
Platelets: 298 10*3/uL (ref 150–400)
RBC: 4.6 MIL/uL (ref 3.87–5.11)
RDW: 12.8 % (ref 11.5–15.5)
WBC: 21.9 10*3/uL — ABNORMAL HIGH (ref 4.0–10.5)
nRBC: 0 % (ref 0.0–0.2)

## 2020-09-15 LAB — URINE DRUG SCREEN, QUALITATIVE (ARMC ONLY)
Amphetamines, Ur Screen: NOT DETECTED
Barbiturates, Ur Screen: NOT DETECTED
Benzodiazepine, Ur Scrn: NOT DETECTED
Cannabinoid 50 Ng, Ur ~~LOC~~: POSITIVE — AB
Cocaine Metabolite,Ur ~~LOC~~: NOT DETECTED
MDMA (Ecstasy)Ur Screen: NOT DETECTED
Methadone Scn, Ur: NOT DETECTED
Opiate, Ur Screen: POSITIVE — AB
Phencyclidine (PCP) Ur S: NOT DETECTED
Tricyclic, Ur Screen: NOT DETECTED

## 2020-09-15 LAB — CBC WITH DIFFERENTIAL/PLATELET
Abs Immature Granulocytes: 0.23 10*3/uL — ABNORMAL HIGH (ref 0.00–0.07)
Basophils Absolute: 0.1 10*3/uL (ref 0.0–0.1)
Basophils Relative: 1 %
Eosinophils Absolute: 1.6 10*3/uL — ABNORMAL HIGH (ref 0.0–0.5)
Eosinophils Relative: 10 %
HCT: 43.8 % (ref 36.0–46.0)
Hemoglobin: 14 g/dL (ref 12.0–15.0)
Immature Granulocytes: 1 %
Lymphocytes Relative: 34 %
Lymphs Abs: 5.7 10*3/uL — ABNORMAL HIGH (ref 0.7–4.0)
MCH: 28.7 pg (ref 26.0–34.0)
MCHC: 32 g/dL (ref 30.0–36.0)
MCV: 89.8 fL (ref 80.0–100.0)
Monocytes Absolute: 0.9 10*3/uL (ref 0.1–1.0)
Monocytes Relative: 5 %
Neutro Abs: 8.1 10*3/uL — ABNORMAL HIGH (ref 1.7–7.7)
Neutrophils Relative %: 49 %
Platelets: 354 10*3/uL (ref 150–400)
RBC: 4.88 MIL/uL (ref 3.87–5.11)
RDW: 12.8 % (ref 11.5–15.5)
Smear Review: NORMAL
WBC: 16.7 10*3/uL — ABNORMAL HIGH (ref 4.0–10.5)
nRBC: 0 % (ref 0.0–0.2)

## 2020-09-15 LAB — RESP PANEL BY RT-PCR (FLU A&B, COVID) ARPGX2
Influenza A by PCR: NEGATIVE
Influenza B by PCR: NEGATIVE
SARS Coronavirus 2 by RT PCR: NEGATIVE

## 2020-09-15 LAB — HCG, QUANTITATIVE, PREGNANCY: hCG, Beta Chain, Quant, S: <1 m[IU]/mL (ref <5)

## 2020-09-15 LAB — HIV ANTIBODY (ROUTINE TESTING W REFLEX): HIV Screen 4th Generation wRfx: NONREACTIVE

## 2020-09-15 LAB — MAGNESIUM: Magnesium: 2.3 mg/dL (ref 1.7–2.4)

## 2020-09-15 MED ORDER — MORPHINE SULFATE (PF) 2 MG/ML IV SOLN
1.0000 mg | INTRAVENOUS | Status: DC | PRN
  Administered 2020-09-15 – 2020-09-16 (×5): 1 mg via INTRAVENOUS
  Filled 2020-09-15 (×5): qty 1

## 2020-09-15 MED ORDER — MAGNESIUM HYDROXIDE 400 MG/5ML PO SUSP
30.0000 mL | Freq: Every day | ORAL | Status: DC | PRN
  Filled 2020-09-15: qty 30

## 2020-09-15 MED ORDER — ONDANSETRON 4 MG PO TBDP
4.0000 mg | ORAL_TABLET | Freq: Three times a day (TID) | ORAL | Status: DC | PRN
  Filled 2020-09-15: qty 1

## 2020-09-15 MED ORDER — MAGNESIUM SULFATE 2 GM/50ML IV SOLN
2.0000 g | Freq: Once | INTRAVENOUS | Status: AC
  Administered 2020-09-15: 2 g via INTRAVENOUS

## 2020-09-15 MED ORDER — ENOXAPARIN SODIUM 40 MG/0.4ML ~~LOC~~ SOLN
40.0000 mg | SUBCUTANEOUS | Status: DC
  Administered 2020-09-15 – 2020-09-16 (×2): 40 mg via SUBCUTANEOUS
  Filled 2020-09-15 (×2): qty 0.4

## 2020-09-15 MED ORDER — ACETAMINOPHEN 650 MG RE SUPP: 650.0000 mg | Freq: Four times a day (QID) | RECTAL | Status: DC | PRN

## 2020-09-15 MED ORDER — DOXYCYCLINE HYCLATE 100 MG PO TABS
100.0000 mg | ORAL_TABLET | Freq: Two times a day (BID) | ORAL | Status: DC
  Administered 2020-09-15 – 2020-09-16 (×3): 100 mg via ORAL
  Filled 2020-09-15 (×3): qty 1

## 2020-09-15 MED ORDER — LORATADINE 10 MG PO TABS
10.0000 mg | ORAL_TABLET | ORAL | Status: AC
  Administered 2020-09-15: 10 mg via ORAL

## 2020-09-15 MED ORDER — IPRATROPIUM-ALBUTEROL 0.5-2.5 (3) MG/3ML IN SOLN
3.0000 mL | Freq: Once | RESPIRATORY_TRACT | Status: AC
  Administered 2020-09-15: 3 mL via RESPIRATORY_TRACT

## 2020-09-15 MED ORDER — TRAZODONE HCL 50 MG PO TABS
25.0000 mg | ORAL_TABLET | Freq: Every evening | ORAL | Status: DC | PRN
  Administered 2020-09-16: 25 mg via ORAL
  Filled 2020-09-15: qty 1

## 2020-09-15 MED ORDER — ONDANSETRON HCL 4 MG/2ML IJ SOLN: 4.0000 mg | Freq: Four times a day (QID) | INTRAMUSCULAR | Status: DC | PRN

## 2020-09-15 MED ORDER — ALBUTEROL (5 MG/ML) CONTINUOUS INHALATION SOLN
10.0000 mg/h | INHALATION_SOLUTION | Freq: Once | RESPIRATORY_TRACT | Status: AC
  Administered 2020-09-15: 10 mg/h via RESPIRATORY_TRACT
  Filled 2020-09-15: qty 20

## 2020-09-15 MED ORDER — GUAIFENESIN ER 600 MG PO TB12
600.0000 mg | ORAL_TABLET | Freq: Two times a day (BID) | ORAL | Status: DC
  Administered 2020-09-15 – 2020-09-16 (×3): 600 mg via ORAL
  Filled 2020-09-15 (×3): qty 1

## 2020-09-15 MED ORDER — ONDANSETRON HCL 4 MG/2ML IJ SOLN
4.0000 mg | INTRAMUSCULAR | Status: AC
  Administered 2020-09-15: 4 mg via INTRAVENOUS
  Filled 2020-09-15: qty 2

## 2020-09-15 MED ORDER — LACTATED RINGERS IV BOLUS
1000.0000 mL | Freq: Once | INTRAVENOUS | Status: AC
  Administered 2020-09-15: 1000 mL via INTRAVENOUS

## 2020-09-15 MED ORDER — ONDANSETRON HCL 4 MG PO TABS: 4.0000 mg | ORAL_TABLET | Freq: Four times a day (QID) | ORAL | Status: DC | PRN

## 2020-09-15 MED ORDER — MORPHINE SULFATE (PF) 4 MG/ML IV SOLN
4.0000 mg | Freq: Once | INTRAVENOUS | Status: AC
Start: 2020-09-15 — End: 2020-09-15
  Administered 2020-09-15: 4 mg via INTRAVENOUS
  Filled 2020-09-15: qty 1

## 2020-09-15 MED ORDER — METHYLPREDNISOLONE SODIUM SUCC 40 MG IJ SOLR
40.0000 mg | Freq: Four times a day (QID) | INTRAMUSCULAR | Status: AC
  Administered 2020-09-15 (×3): 40 mg via INTRAVENOUS
  Filled 2020-09-15 (×3): qty 1

## 2020-09-15 MED ORDER — ACETAMINOPHEN 325 MG PO TABS
650.0000 mg | ORAL_TABLET | Freq: Four times a day (QID) | ORAL | Status: DC | PRN
  Administered 2020-09-15 – 2020-09-16 (×3): 650 mg via ORAL
  Filled 2020-09-15 (×3): qty 2

## 2020-09-15 MED ORDER — POTASSIUM CHLORIDE CRYS ER 20 MEQ PO TBCR
40.0000 meq | EXTENDED_RELEASE_TABLET | Freq: Once | ORAL | Status: AC
  Administered 2020-09-15: 40 meq via ORAL
  Filled 2020-09-15: qty 2

## 2020-09-15 MED ORDER — DM-GUAIFENESIN ER 30-600 MG PO TB12: 1.0000 | ORAL_TABLET | Freq: Two times a day (BID) | ORAL | Status: DC | PRN

## 2020-09-15 MED ORDER — EPINEPHRINE 0.3 MG/0.3ML IJ SOAJ
0.3000 mg | Freq: Once | INTRAMUSCULAR | Status: DC | PRN
  Filled 2020-09-15: qty 0.3

## 2020-09-15 MED ORDER — NORETHIN ACE-ETH ESTRAD-FE 1.5-30 MG-MCG PO TABS
1.0000 | ORAL_TABLET | Freq: Every day | ORAL | Status: DC
Start: 2020-09-15 — End: 2020-09-16

## 2020-09-15 MED ORDER — PREDNISONE 20 MG PO TABS
40.0000 mg | ORAL_TABLET | Freq: Every day | ORAL | Status: DC
  Administered 2020-09-16: 40 mg via ORAL
  Filled 2020-09-15: qty 2

## 2020-09-15 MED ORDER — IPRATROPIUM-ALBUTEROL 0.5-2.5 (3) MG/3ML IN SOLN
3.0000 mL | Freq: Four times a day (QID) | RESPIRATORY_TRACT | Status: DC
  Administered 2020-09-15 – 2020-09-16 (×5): 3 mL via RESPIRATORY_TRACT
  Filled 2020-09-15 (×6): qty 3

## 2020-09-15 MED ORDER — MONTELUKAST SODIUM 10 MG PO TABS
10.0000 mg | ORAL_TABLET | Freq: Every day | ORAL | Status: DC
  Administered 2020-09-15: 20:00:00 10 mg via ORAL
  Filled 2020-09-15 (×2): qty 1

## 2020-09-15 MED ORDER — METHYLPREDNISOLONE SODIUM SUCC 125 MG IJ SOLR
125.0000 mg | Freq: Once | INTRAMUSCULAR | Status: AC
  Administered 2020-09-15: 125 mg via INTRAVENOUS

## 2020-09-15 MED ORDER — SODIUM CHLORIDE 0.9 % IV SOLN: INTRAVENOUS | Status: DC

## 2020-09-15 NOTE — ED Notes (Signed)
Pt now receiving duoneb x3 respiratory treatment-- xray now at bedside to complete [facility]

## 2020-09-15 NOTE — H&P (Signed)
[facility]   Angela Mckenzie NAME: Angela Mckenzie    MR#:  [id number]  DATE OF BIRTH:  [birth year]  DATE OF ADMISSION:  09/15/2020  PRIMARY CARE PHYSICIAN: Angela Mckenzie, No Pcp Per   Angela Mckenzie is coming from: Home  REQUESTING/REFERRING PHYSICIAN: Loleta Rose, MD  CHIEF COMPLAINT:   Chief Complaint  Angela Mckenzie presents with  . Shortness of Breath    HISTORY OF PRESENT ILLNESS:  Angela Mckenzie is a 20 y.o. Caucasian female with medical history significant for asthma, who presented to the emergency room with a [place] of acute asthma attack with dyspnea as well as cough productive of yellowish-white sputum and wheezing since last night.  She denies any fever or chills.  No nausea or vomiting or abdominal pain.  She took 3 nebulized albuterol treatments at home without help.  She admits to chest pain with deep breathing.  No rhinorrhea or nasal congestion or sore throat or earache.  No dysuria, oliguria or hematuria or flank pain.  This is the Angela Mckenzie's third ER visit here over the last couple weeks for acute asthma exacerbation. ED Course: When she came to the ER blood pressure was 128/105 with a heart rate of 145 and respiratory test 15 with a pulse oximetry of 96 and later 99% on 3 L of O2 by nasal cannula EKG as reviewed by me : showedsinus tachycardia with a rate of 142 with left atrial enlargement possibly biatrial enlargement  Imaging: Chest x-ray showed no acute cardiopulmonary disease.  The Angela Mckenzie was given 3 DuoNeb's and later continuous albuterol treatment, 1 L bolus of IV lactated Ringer, 125 mg IV Solu-Medrol, 2 g of IV magnesium sulfate and 10 mg p.o. Claritin, 4 mg of IV morphine sulfate and 4 mg of IV Zofran.  She will be admitted to a medical monitored bed for further evaluation and management. PAST MEDICAL HISTORY:   Past Medical History:  Diagnosis Date  . Asthma     PAST SURGICAL HISTORY:   Past Surgical History:  Procedure Laterality Date  . BREAST LUMPECTOMY    .  WISDOM TOOTH EXTRACTION      SOCIAL HISTORY:   Social History   Tobacco Use  . Smoking status: Current Every Day Smoker    Packs/day: 0.25    Types: Cigarettes  . Smokeless tobacco: Never Used  Substance Use Topics  . Alcohol use: Not Currently    FAMILY HISTORY:  Positive for diabetes mellitus, hypertension and dyslipidemia. DRUG ALLERGIES:  No Known Allergies  REVIEW OF SYSTEMS:   ROS As per history of present illness. All pertinent systems were reviewed above. Constitutional, HEENT, cardiovascular, respiratory, GI, GU, musculoskeletal, neuro, psychiatric, endocrine, integumentary and hematologic systems were reviewed and are otherwise negative/unremarkable except for positive findings mentioned above in the HPI.   MEDICATIONS AT HOME:   Prior to Admission medications   Medication Sig Start Date End Date Taking? Authorizing Provider  albuterol (PROVENTIL) (2.5 MG/3ML) 0.083% nebulizer solution Take 3 mLs (2.5 mg total) by nebulization every 6 (six) hours as needed for wheezing or shortness of breath. 09/07/20   [name], MD  doxycycline (VIBRAMYCIN) 100 MG capsule Take 1 capsule (100 mg total) by mouth 2 (two) times daily. 06/30/18   [name], DO  EPINEPHrine 0.3 mg/0.3 mL IJ SOAJ injection PLEASE SEE ATTACHED FOR DETAILED DIRECTIONS 01/31/18   [provider]  ipratropium-albuterol (DUONEB) 0.5-2.5 (3) MG/3ML SOLN Take 3 mLs by nebulization every 6 (six) hours as needed. 08/31/20   [name],  MD  JUNEL FE 1.5/30 1.5-30 MG-MCG tablet Take 1 tablet by mouth daily. 01/30/18   [provider]  montelukast (SINGULAIR) 10 MG tablet  02/26/18   [provider]  ondansetron (ZOFRAN ODT) 4 MG disintegrating tablet Take 1 tablet (4 mg total) by mouth every 8 (eight) hours as needed for nausea or vomiting. 06/30/18   [name], DO  predniSONE (DELTASONE) 50 MG tablet Take 1 tablet (50 mg total) by mouth daily with breakfast. 08/31/20   [name], MD   valACYclovir (VALTREX) 500 MG tablet Take 500 mg by mouth daily. 12/05/17   [provider]      VITAL SIGNS:  Blood pressure (!) 128/105, pulse (!) 145, temperature 98.3 F (36.8 C), height 5\' 6"  (1.676 m), weight 69.4 kg, last menstrual period 09/08/2020, SpO2 96 %.  PHYSICAL EXAMINATION:  Physical Exam  GENERAL:  20 y.o.-year-old Caucasian female Angela Mckenzie lying in the bed with mild to moderate respiratory distress with conversational dyspnea. EYES: Pupils equal, round, reactive to light and accommodation. No scleral icterus. Extraocular muscles intact.  HEENT: Head atraumatic, normocephalic. Oropharynx and nasopharynx clear.  NECK:  Supple, no jugular venous distention. No thyroid enlargement, no tenderness.  LUNGS: Diffuse expiratory wheezes with tight expiratory airflow and harsh vesicular breathing.  CARDIOVASCULAR: Regular rate and rhythm, S1, S2 normal. No murmurs, rubs, or gallops.  ABDOMEN: Soft, nondistended, nontender. Bowel sounds present. No organomegaly or mass.  EXTREMITIES: No pedal edema, cyanosis, or clubbing.  NEUROLOGIC: Cranial nerves II through XII are intact. Muscle strength 5/5 in all extremities. Sensation intact. Gait not checked.  PSYCHIATRIC: The Angela Mckenzie is alert and oriented x 3.  Normal affect and good eye contact. SKIN: No obvious rash, lesion, or ulcer.   LABORATORY PANEL:   CBC Recent Labs  Lab 09/15/20 0231  WBC 16.7*  HGB 14.0  HCT 43.8  PLT 354   ------------------------------------------------------------------------------------------------------------------  Chemistries  Recent Labs  Lab 09/15/20 0231  NA 138  K 4.0  CL 104  CO2 27  GLUCOSE 115*  BUN 16  CREATININE 0.63  CALCIUM 9.1  MG 2.3   ------------------------------------------------------------------------------------------------------------------  Cardiac Enzymes No results for input(s): TROPONINI in the last 168  hours. ------------------------------------------------------------------------------------------------------------------  RADIOLOGY:  DG Chest Portable 1 View  Result Date: 09/15/2020 CLINICAL DATA:  Dyspnea EXAM: PORTABLE CHEST 1 VIEW COMPARISON:  09/07/2020 FINDINGS: The heart size and mediastinal contours are within normal limits. Both lungs are clear. The visualized skeletal structures are unremarkable. IMPRESSION: No active disease. Electronically Signed   By: 09/09/2020 M.D.   On: 09/15/2020 02:47      IMPRESSION AND PLAN:  Active Problems:   Acute severe asthma  1.  Acute severe persistent asthma with status asthmaticus likely secondary to acute bronchitis.  The Angela Mckenzie has subsequent acute hypoxic respiratory failure. -The Angela Mckenzie will be admitted to a medical monitored bed. -We will place her on scheduled and as needed duo nebs. -We will continue her IV steroid therapy with Solu-Medrol. -Mucolytic therapy will be provided. -We will continue her Singulair. She was given IV magnesium sulfate -We will continue her budesonide nebulization. -Given significant sputum production and recurrent asthma attack over the last couple of weeks, I will go ahead and start her on IV Rocephin and Zithromax.  2.  Leukocytosis. -This like secondary to recent steroid treatment. -We will follow her CBC.  DVT prophylaxis: Lovenox. Code Status: full code. Family Communication:  The plan of care was discussed in details with the Angela Mckenzie who requested  no family members to be notified.  I answered all questions. The Angela Mckenzie agreed to proceed with the above mentioned plan. Further management will depend upon hospital course. Disposition Plan: Back to previous home environment Consults called: none.   All the records are reviewed and case discussed with ED provider.  Status is: Inpatient  Remains inpatient appropriate because:Ongoing diagnostic testing needed not appropriate for outpatient work  up, Unsafe d/c plan, IV treatments appropriate due to intensity of illness or inability to take PO and Inpatient level of care appropriate due to severity of illness   Dispo: The Angela Mckenzie is from: Home              Anticipated d/c is to: Home              Angela Mckenzie currently is not medically stable to d/c.   Difficult to place Angela Mckenzie No  TOTAL TIME TAKING CARE OF THIS Angela Mckenzie: 55 minutes.    Hannah Beat M.D on 09/15/2020 at 3:40 AM  [facility]   From 7 PM-7 AM, contact night-coverage [email or web address]  CC: Primary care physician; Angela Mckenzie, No Pcp Per

## 2020-09-15 NOTE — Consult Note (Signed)
Name: Angela Mckenzie MRN: [id number] DOB: 2001-05-28     CONSULTATION DATE: 09/15/2020  REFERRING MD : Margo Aye  CHIEF COMPLAINT: severe SOB  STUDIES:     CXR independently reviewed by Me No acute process  HISTORY OF PRESENT ILLNESS: 20 y.o. female with medical history significant for asthma her whole life who presented to the emergency room with a severe acute asthma attack  with dyspnea as well as cough productive of yellowish-white sputum and wheezing since last night.     She denies any fever or chills.  No nausea or vomiting or abdominal pain.   She has chest tightness and back pain, severe back pain She took 3 nebulized albuterol treatments at home without help.    She admits to chest pain with deep breathing.   No rhinorrhea or nasal congestion or sore throat or earache.  No dysuria, oliguria or hematuria or flank pain.    4-5 severe asthma attacks since JAN 2022 This is when she purchased a cat for a pet and she is allergic to everything cats and dogs.  In the ER- patient was given 3 DuoNeb's and later continuous albuterol treatment 1L bolus of IV lactated Ringer, 125 mg IV Solu-Medrol, 2 g of IV magnesium sulfate and 10 mg p.o. Claritin, 4 mg of IV morphine sulfate and 4 mg of IV Zofran.   Patient feels better since admission She only takes alb and NEBS as needed No maintenance therapy  TOX screen +marijuana PREG test NEG +cat exposure +smoking and vaping exposures    PAST MEDICAL HISTORY :   has a past medical history of Asthma.  has a past surgical history that includes Breast lumpectomy and Wisdom tooth extraction. Prior to Admission medications   Medication Sig Start Date End Date Taking? Authorizing Provider  albuterol (PROVENTIL) (2.5 MG/3ML) 0.083% nebulizer solution Take 3 mLs (2.5 mg total) by nebulization every 6 (six) hours as needed for wheezing or shortness of breath. 09/07/20  Yes [name], MD  albuterol (VENTOLIN HFA) 108 (90 Base) MCG/ACT  inhaler Inhale 2 puffs into the lungs every 6 (six) hours as needed.   Yes [provider]  cetirizine (ZYRTEC) 10 MG tablet Take 10 mg by mouth daily.   Yes [provider]  ipratropium-albuterol (DUONEB) 0.5-2.5 (3) MG/3ML SOLN Take 3 mLs by nebulization every 6 (six) hours as needed. 08/31/20  Yes [name], MD  norgestimate-ethinyl estradiol (ORTHO-CYCLEN) 0.25-35 MG-MCG tablet Take 1 tablet by mouth daily. 08/19/20  Yes [provider]  doxycycline (VIBRAMYCIN) 100 MG capsule Take 1 capsule (100 mg total) by mouth 2 (two) times daily. Patient not taking: Reported on 09/15/2020 06/30/18   [name], DO  EPINEPHrine 0.3 mg/0.3 mL IJ SOAJ injection PLEASE SEE ATTACHED FOR DETAILED DIRECTIONS Patient not taking: Reported on 09/15/2020 01/31/18   [provider]  JUNEL FE 1.5/30 1.5-30 MG-MCG tablet Take 1 tablet by mouth daily. Patient not taking: No sig reported 01/30/18   [provider]  montelukast (SINGULAIR) 10 MG tablet  02/26/18   [provider]  ondansetron (ZOFRAN ODT) 4 MG disintegrating tablet Take 1 tablet (4 mg total) by mouth every 8 (eight) hours as needed for nausea or vomiting. Patient not taking: Reported on 09/15/2020 06/30/18   [name], DO  predniSONE (DELTASONE) 50 MG tablet Take 1 tablet (50 mg total) by mouth daily with breakfast. Patient not taking: No sig reported 08/31/20   [name], MD  valACYclovir (VALTREX) 500 MG tablet Take 500  mg by mouth daily. Patient not taking: Reported on 09/15/2020 12/05/17   [provider]   Allergies  Allergen Reactions  . Remeron [Mirtazapine]     Urine retention    FAMILY HISTORY:  family history is not on file. SOCIAL HISTORY:  reports that she has been smoking cigarettes. She has been smoking about 0.25 packs per day. She has never used smokeless tobacco. She reports previous alcohol use. She reports current drug use. Drug: Marijuana.    Review of  Systems:  Gen:  Denies  fever, sweats, chills weigh loss  HEENT: Denies blurred vision, double vision, ear pain, eye pain, hearing loss, nose bleeds, sore throat Cardiac:  No dizziness, chest pain or heaviness, chest tightness,edema, No JVD Resp:   No cough, -sputum production, -shortness of breath,-wheezing, -hemoptysis,  Gi: Denies swallowing difficulty, stomach pain, nausea or vomiting, diarrhea, constipation, bowel incontinence Gu:  Denies bladder incontinence, burning urine Ext:   Denies Joint pain, stiffness or swelling Skin: Denies  skin rash, easy bruising or bleeding or hives Endoc:  Denies polyuria, polydipsia , polyphagia or weight change Psych:   Denies depression, insomnia or hallucinations  Other:  All other systems negative     VITAL SIGNS: Temp:  [97.6 F (36.4 C)-98.3 F (36.8 C)] 98 F (36.7 C) (03/29 1501) Pulse Rate:  [96-145] 100 (03/29 1501) Resp:  [15-20] 18 (03/29 1501) BP: (99-135)/(62-105) 132/73 (03/29 1501) SpO2:  [96 %-100 %] 97 % (03/29 1541) Weight:  [69.4 kg] 69.4 kg (03/29 0229)     SpO2: 97 % O2 Flow Rate (L/min): 3 L/min    Physical Examination:   GENERAL:NAD, no fevers, chills, no weakness no fatigue HEAD: Normocephalic, atraumatic.  EYES: PERLA, EOMI No scleral icterus.  MOUTH: Moist mucosal membrane.  EAR, NOSE, THROAT: Clear without exudates. No external lesions.  NECK: Supple.  PULMONARY: CTA B/L no wheezing, rhonchi, crackles CARDIOVASCULAR: S1 and S2. Regular rate and rhythm. No murmurs GASTROINTESTINAL: Soft, nontender, nondistended. Positive bowel sounds.  MUSCULOSKELETAL: No swelling, clubbing, or edema.  NEUROLOGIC: No gross focal neurological deficits. 5/5 strength all extremities SKIN: No ulceration, lesions, rashes, or cyanosis.  PSYCHIATRIC: Insight, judgment intact. -depression -anxiety ALL OTHER ROS ARE NEGATIVE   MEDICATIONS: I have reviewed all medications and confirmed regimen as documented    CULTURE  RESULTS   Recent Results (from the past 240 hour(s))  Resp Panel by RT-PCR (Flu A&B, Covid) Nasopharyngeal Swab     Status: None   Collection Time: 09/15/20  3:13 AM   Specimen: Nasopharyngeal Swab; Nasopharyngeal(NP) swabs in vial transport medium  Result Value Ref Range Status   SARS Coronavirus 2 by RT PCR NEGATIVE NEGATIVE Final    Comment: (NOTE) SARS-CoV-2 target nucleic acids are NOT DETECTED.  The SARS-CoV-2 RNA is generally detectable in upper respiratory specimens during the acute phase of infection. The lowest concentration of SARS-CoV-2 viral copies this assay can detect is 138 copies/mL. A negative result does not preclude SARS-Cov-2 infection and should not be used as the sole basis for treatment or other patient management decisions. A negative result may occur with  improper specimen collection/handling, submission of specimen other than nasopharyngeal swab, presence of viral mutation(s) within the areas targeted by this assay, and inadequate number of viral copies(<138 copies/mL). A negative result must be combined with clinical observations, patient history, and epidemiological information. The expected result is Negative.  Fact Sheet for Patients:  BloggerCourse.com  Fact Sheet for Healthcare Providers:  SeriousBroker.it  This test is no t  yet approved or cleared by the [place] and  has been authorized for detection and/or diagnosis of SARS-CoV-2 by FDA under an Emergency Use Authorization (EUA). This EUA will remain  in effect (meaning this test can be used) for the duration of the COVID-19 declaration under Section 564(b)(1) of the Act, 21 U.S.C.section 360bbb-3(b)(1), unless the authorization is terminated  or revoked sooner.       Influenza A by PCR NEGATIVE NEGATIVE Final   Influenza B by PCR NEGATIVE NEGATIVE Final    Comment: (NOTE) The Xpert Xpress SARS-CoV-2/FLU/RSV plus assay is intended  as an aid in the diagnosis of influenza from Nasopharyngeal swab specimens and should not be used as a sole basis for treatment. Nasal washings and aspirates are unacceptable for Xpert Xpress SARS-CoV-2/FLU/RSV testing.  Fact Sheet for Patients: BloggerCourse.com  Fact Sheet for Healthcare Providers: SeriousBroker.it  This test is not yet approved or cleared by the [place] FDA and has been authorized for detection and/or diagnosis of SARS-CoV-2 by FDA under an Emergency Use Authorization (EUA). This EUA will remain in effect (meaning this test can be used) for the duration of the COVID-19 declaration under Section 564(b)(1) of the Act, 21 U.S.C. section 360bbb-3(b)(1), unless the authorization is terminated or revoked.  Performed at [facility], [address] [place], [place] [address]           IMAGING    DG Chest Portable 1 View  Result Date: 09/15/2020 CLINICAL DATA:  Dyspnea EXAM: PORTABLE CHEST 1 VIEW COMPARISON:  09/07/2020 FINDINGS: The heart size and mediastinal contours are within normal limits. Both lungs are clear. The visualized skeletal structures are unremarkable. IMPRESSION: No active disease. Electronically Signed   By: Alcide Clever M.D.   On: 09/15/2020 02:47       ASSESSMENT AND PLAN SYNOPSIS  SEVERE PERSISTENT ACUTE ASTHMA WITH EXACERBATION FROM ENVIRONMENTAL EXPOSURES TO CATS AND SMOKE AND VAPING  Continue IV steroids NEB therapy as prescribed Recommend DULERA 200 as outpatient  Therapy, will order as inpatient Recommend avoiding cats/dogs Avoid triggers-smoking and vaping  I have explained that as long as she is exposed to allergens she will continue to have asthma attacks  Will follow up as outpatient    Patient are satisfied with Plan of action and management. All questions answered  Lucie Leather, M.D.  [facility]  Medical Director  [facility] Medical Director [facility] Cardio-Pulmonary Department

## 2020-09-15 NOTE — ED Notes (Signed)
Pt now sitting up in bed in semi-fowler's - no longer tripoding- relaxed and conversing with visitor.  RR now even and unlabored on 3L O2 via [place] .  Speech is clear with normal cadence.  Cardiac and pulse ox monitoring maintained.  Will continue to monitor for acute changes and maintain plan of care.

## 2020-09-15 NOTE — Progress Notes (Signed)
Angela Mckenzie is a 20 y.o. Caucasian female with medical history significant for asthma, who presented to the emergency room with a [place] of acute asthma attack with dyspnea as well as cough productive of yellowish-white sputum and wheezing since last night.  She denies any fever or chills.  No nausea or vomiting or abdominal pain.  She took 3 nebulized albuterol treatments at home without help.  She admits to chest pain with deep breathing.  No rhinorrhea or nasal congestion or sore throat or earache.  No dysuria, oliguria or hematuria or flank pain.  This is the patient's third ER visit here over the last couple weeks for acute asthma exacerbation. ED Course: When she came to the ER blood pressure was 128/105 with a heart rate of 145 and respiratory test 15 with a pulse oximetry of 96 and later 99% on 3 L of O2 by nasal cannula EKG as reviewed by me : showedsinus tachycardia with a rate of 142 with left atrial enlargement possibly biatrial enlargement  Imaging: Chest x-ray showed no acute cardiopulmonary disease.  The patient was given 3 DuoNeb's and later continuous albuterol treatment, 1 L bolus of IV lactated Ringer, 125 mg IV Solu-Medrol, 2 g of IV magnesium sulfate and 10 mg p.o. Claritin, 4 mg of IV morphine sulfate and 4 mg of IV Zofran.  She will be admitted to a medical monitored bed for further evaluation and management.  09/15/20: patient was seen and examined at her bedside in the ED.  She reports chest tightness as well as dyspnea with minimal exertion.  Due to 2-3 recurrent asthma exacerbation within the last month and poorly controlled asthma, pulmonary was consulted.  She is receptive to following up with pulmonary outpatient after discharge as she currently does not have a pulmonologist.  Please refer to H&P dictated by my partner Dr. Arville Care on 09/15/2020 for further details of the assessment and plan.

## 2020-09-15 NOTE — ED Provider Notes (Signed)
[facility] Emergency Department Provider Note  ____________________________________________   Event Date/Time   First MD Initiated Contact with Patient 09/15/20 0222     (approximate)  I have reviewed the triage vital signs and the nursing notes.   HISTORY  Chief Complaint Shortness of Breath  Level 5 caveat:  history/ROS limited by acute/critical illness  HPI Angela Mckenzie is a 20 y.o. female with history of asthma and presents by private vehicle for evaluation of severe shortness of breath.   She has been feeling gradually worse over the last several days.  She has had 2 additional ED visits over the last couple of weeks also for asthma.  She said that she completed a 5-day course of prednisone after last visit.  She tried doing 3 nebulizer treatments at home today and they do not seem to help.  She has not had any fever.  She has not had any nausea or vomiting.  The only thing she can think of that is relatively new in her household is that she has a cat and she knows that she has a cat allergy but she has had it for several months and has only really been having trouble for the last couple of weeks.  Her symptoms are severe and nothing is helping and she had an SPO2 of 70% on room air when she arrived with severe respiratory distress.        Past Medical History:  Diagnosis Date  . Asthma     There are no problems to display for this patient.   Past Surgical History:  Procedure Laterality Date  . BREAST LUMPECTOMY    . WISDOM TOOTH EXTRACTION      Prior to Admission medications   Medication Sig Start Date End Date Taking? Authorizing Provider  albuterol (PROVENTIL) (2.5 MG/3ML) 0.083% nebulizer solution Take 3 mLs (2.5 mg total) by nebulization every 6 (six) hours as needed for wheezing or shortness of breath. 09/07/20   Jene EveryKinner, Robert, MD  doxycycline (VIBRAMYCIN) 100 MG capsule Take 1 capsule (100 mg total) by mouth 2 (two) times daily.  06/30/18   Melene PlanFloyd, Dan, DO  EPINEPHrine 0.3 mg/0.3 mL IJ SOAJ injection PLEASE SEE ATTACHED FOR DETAILED DIRECTIONS 01/31/18   [provider]  ipratropium-albuterol (DUONEB) 0.5-2.5 (3) MG/3ML SOLN Take 3 mLs by nebulization every 6 (six) hours as needed. 08/31/20   Jene EveryKinner, Robert, MD  JUNEL FE 1.5/30 1.5-30 MG-MCG tablet Take 1 tablet by mouth daily. 01/30/18   [provider]  montelukast (SINGULAIR) 10 MG tablet  02/26/18   [provider]  ondansetron (ZOFRAN ODT) 4 MG disintegrating tablet Take 1 tablet (4 mg total) by mouth every 8 (eight) hours as needed for nausea or vomiting. 06/30/18   Melene PlanFloyd, Dan, DO  predniSONE (DELTASONE) 50 MG tablet Take 1 tablet (50 mg total) by mouth daily with breakfast. 08/31/20   Jene EveryKinner, Robert, MD  valACYclovir (VALTREX) 500 MG tablet Take 500 mg by mouth daily. 12/05/17   [provider]    Allergies Patient has no known allergies.  No family history on file.  Social History Social History   Tobacco Use  . Smoking status: Current Every Day Smoker    Packs/day: 0.25    Types: Cigarettes  . Smokeless tobacco: Never Used  Vaping Use  . Vaping Use: Some days  Substance Use Topics  . Alcohol use: Not Currently  . Drug use: Yes    Types: Marijuana    Review of Systems  Level 5 caveat:  history/ROS limited by acute/critical illness  Constitutional: No fever/chills Eyes: No visual changes. ENT: No sore throat. Cardiovascular: Denies chest pain. Respiratory: Severe respiratory distress. Gastrointestinal: No abdominal pain.  No nausea, no vomiting.  No diarrhea.  No constipation. Genitourinary: Negative for dysuria. Musculoskeletal: Negative for neck pain.  Negative for back pain. Integumentary: Negative for rash. Neurological: Negative for headaches, focal weakness or numbness.   ____________________________________________   PHYSICAL EXAM:  VITAL SIGNS: ED Triage Vitals  Enc Vitals Group     BP 09/15/20 0221  (!) 128/105     Pulse Rate 09/15/20 0221 (!) 145     Resp --      Temp 09/15/20 0221 98.3 F (36.8 C)     Temp src --      SpO2 09/15/20 0221 96 %     Weight 09/15/20 0229 69.4 kg (153 lb)     Height 09/15/20 0229 1.676 m (5\' 6" )     Head Circumference --      Peak Flow --      Pain Score 09/15/20 0224 8     Pain Loc --      Pain Edu? --      Excl. in GC? --     Constitutional: Alert and oriented.  Severe distress. Eyes: Conjunctivae are normal.  Head: Atraumatic. Nose: No congestion/rhinnorhea. Mouth/Throat: Patient is wearing a mask. Neck: No stridor.  No meningeal signs.   Cardiovascular: Tachycardia, regular rhythm. Good peripheral circulation. Respiratory: Severe distress, tripoding, accessory muscle usage and intercostal retractions.  Upon auscultation the patient has a very mild he is but very minimal Gastrointestinal: Soft and nontender. No distention.  Musculoskeletal: No lower extremity tenderness nor edema. No gross deformities of extremities. Neurologic:  Normal speech and language other than limited by respiratory distress.. No gross focal neurologic deficits are appreciated.  Skin:  Skin is warm, dry and intact. Psychiatric: Mood and affect are normal and appropriate for the circumstances.  ____________________________________________   LABS (all labs ordered are listed, but only abnormal results are displayed)  Labs Reviewed  CBC WITH DIFFERENTIAL/PLATELET - Abnormal; Notable for the following components:      Result Value   WBC 16.7 (*)    All other components within normal limits  BASIC METABOLIC PANEL - Abnormal; Notable for the following components:   Glucose, Bld 115 (*)    All other components within normal limits  RESP PANEL BY RT-PCR (FLU A&B, COVID) ARPGX2  MAGNESIUM  POC URINE PREG, ED   ____________________________________________  EKG  ED ECG REPORT I, 09/17/20, the attending physician, personally viewed and interpreted this  ECG.  Date: 09/15/2020 EKG Time: 2:23 AM Rate: 142 Rhythm: Sinus tachycardia QRS Axis: normal Intervals: normal ST/T Wave abnormalities: Non-specific ST segment / T-wave changes, but no clear evidence of acute ischemia. Narrative Interpretation: no definitive evidence of acute ischemia; does not meet STEMI criteria.   ____________________________________________  RADIOLOGY I, 09/17/2020, personally viewed and evaluated these images (plain radiographs) as part of my medical decision making, as well as reviewing the written report by the radiologist.  ED MD interpretation: No acute abnormality on chest x-ray  Official radiology report(s): DG Chest Portable 1 View  Result Date: 09/15/2020 CLINICAL DATA:  Dyspnea EXAM: PORTABLE CHEST 1 VIEW COMPARISON:  09/07/2020 FINDINGS: The heart size and mediastinal contours are within normal limits. Both lungs are clear. The visualized skeletal structures are unremarkable. IMPRESSION: No active disease. Electronically Signed   By: 09/09/2020  M.D.   On: 09/15/2020 02:47    ____________________________________________   PROCEDURES   Procedure(s) performed (including Critical Care):  .Critical Care Performed by: Loleta Rose, MD Authorized by: Loleta Rose, MD   Critical care provider statement:    Critical care time (minutes):  45   Critical care time was exclusive of:  Separately billable procedures and treating other patients   Critical care was necessary to treat or prevent imminent or life-threatening deterioration of the following conditions:  Respiratory failure   Critical care was time spent personally by me on the following activities:  Development of treatment plan with patient or surrogate, discussions with consultants, evaluation of patient's response to treatment, examination of patient, obtaining history from patient or surrogate, ordering and performing treatments and interventions, ordering and review of laboratory  studies, ordering and review of radiographic studies, pulse oximetry, re-evaluation of patient's condition and review of old charts .1-3 Lead EKG Interpretation Performed by: Loleta Rose, MD Authorized by: Loleta Rose, MD     Interpretation: abnormal     ECG rate:  144   ECG rate assessment: tachycardic     Rhythm: sinus tachycardia     Ectopy: none     Conduction: normal       ____________________________________________   INITIAL IMPRESSION / MDM / ASSESSMENT AND PLAN / ED COURSE  As part of my medical decision making, I reviewed the following data within the electronic MEDICAL RECORD NUMBER Nursing notes reviewed and incorporated, Labs reviewed , EKG interpreted , Old chart reviewed, Radiograph reviewed  and reviewed Notes from prior ED visits   Differential diagnosis includes, but is not limited to, asthma exacerbation, pneumothorax, allergic reaction, less likely pneumonia or infectious process.  The patient is on the cardiac monitor to evaluate for evidence of arrhythmia and/or significant heart rate changes.  Patient is also on continuous pulse oximeter.  Patient in severe respiratory distress, brought immediately to exam room and evaluated by me.  Duo nebs x3, Solu-Medrol 125 mg IV, famotidine 20 mg IV, loratadine 10 mg PO, magnesium 2 g IV.  Chest x-ray pending to rule out pneumothorax although her breath sounds are equal though very diminished.  Based on her current presentation I anticipate she will need to be admitted but we will see how she improves after treatment.  Patient understands and agrees.  Mentating normally.     Clinical Course as of 09/15/20 0324  Tue Sep 15, 2020  0231 Patient feels a little bit better but not worse.  She is still retracting but no longer tripoding quite severely.  She says she has carpal pain in her back as result of her breathing.  Her IV infiltrated and she has a small raised and indurated area in the left antecubital fossa.  No  neurovascular compromise.  A new IV has been placed.  I ordered continuous albuterol 10 mg/h nebulizer.  Morphine 4 g IV and Zofran 4 mg IV for pain and to prevent nausea and vomiting which could further compromise her airway.  She is persistently tachycardic and had substantial insensible losses due to her respiratory failure and is also at risk of developing a lactic acidosis due to the respiratory failure and her albuterol treatments I am ordering 1 L lactated Ringer's IV fluid bolus. [CF]  0242 Of note, the Pyxis system has crashed and medication orders are not crossing over and it is not possible to remove medications by the normal mechanism.  This is an emergent situation requiring time sensitive intervention.  I personally stood at the Pyxis with the charge nurse Erie Noe and verified each of the medications as they were taken out of Pyxis.  The only exception is the famotidine 20 mg IV which we are awaiting from pharmacy. [CF]  0306 Leukocytosis, likely due to stress or her recent use of steroids.  Basic metabolic panel is normal. [CF]  0307 I personally reviewed the patient's imaging and agree with the radiologist's interpretation that there is no evidence of an emergent medical condition such as pneumothorax or lobar pneumonia. [CF]  [id number] Respiratory viral panel is pending.  I am consulting the hospitalist for admission. [CF]  [phone] Discussed case by phone with Dr. Arville Care who will admit. [CF]    Clinical Course User Index [CF] Loleta Rose, MD     ____________________________________________  FINAL CLINICAL IMPRESSION(S) / ED DIAGNOSES  Final diagnoses:  Severe persistent asthma with status asthmaticus  Acute respiratory failure with hypoxemia (HCC)     MEDICATIONS GIVEN DURING THIS VISIT:  Medications  magnesium sulfate IVPB 2 g 50 mL (has no administration in time range)  loratadine (CLARITIN) tablet 10 mg (has no administration in time range)  morphine 4 MG/ML injection 4 mg  (has no administration in time range)  ondansetron (ZOFRAN) injection 4 mg (has no administration in time range)  albuterol (PROVENTIL,VENTOLIN) solution continuous neb (has no administration in time range)  lactated ringers bolus 1,000 mL (has no administration in time range)  methylPREDNISolone sodium succinate (SOLU-MEDROL) 125 mg/2 mL injection 125 mg (125 mg Intravenous Given 09/15/20 0248)  ipratropium-albuterol (DUONEB) 0.5-2.5 (3) MG/3ML nebulizer solution 3 mL (3 mLs Nebulization Given 09/15/20 0239)  ipratropium-albuterol (DUONEB) 0.5-2.5 (3) MG/3ML nebulizer solution 3 mL (3 mLs Nebulization Given 09/15/20 0240)  ipratropium-albuterol (DUONEB) 0.5-2.5 (3) MG/3ML nebulizer solution 3 mL (3 mLs Nebulization Given 09/15/20 0240)     ED Discharge Orders    None      *Please note:  Diasia Henken was evaluated in Emergency Department on 09/15/2020 for the symptoms described in the history of present illness. She was evaluated in the context of the global COVID-19 pandemic, which necessitated consideration that the patient might be at risk for infection with the SARS-CoV-2 virus that causes COVID-19. Institutional protocols and algorithms that pertain to the evaluation of patients at risk for COVID-19 are in a state of rapid change based on information released by regulatory bodies including the CDC and federal and state organizations. These policies and algorithms were followed during the patient's care in the ED.  Some ED evaluations and interventions may be delayed as a result of limited staffing during and after the pandemic.*  Note:  This document was prepared using Dragon voice recognition software and may include unintentional dictation errors.   Loleta Rose, MD 09/15/20 [phone]

## 2020-09-15 NOTE — ED Notes (Signed)
Neb treatment remains in progress -- pt reports improvements already noted at this time

## 2020-09-15 NOTE — ED Triage Notes (Signed)
Pt arrives from triage via w/c undergoing neb treatment -- initial O2 sats on RA upon arrival 70s per first nurse.  Pt noted to be tripoding after being xferred over to stretcher and unable to speak in clear complete sentences.  Now on 3L O2 via [place] via [place] - O2 sats 96%.

## 2020-09-15 NOTE — ED Notes (Signed)
Message Sent to floor Nurse

## 2020-09-15 NOTE — ED Notes (Signed)
Pt transported via ED tech  

## 2020-09-16 MED ORDER — MOMETASONE FURO-FORMOTEROL FUM 200-5 MCG/ACT IN AERO: 2.0000 | INHALATION_SPRAY | Freq: Two times a day (BID) | RESPIRATORY_TRACT | 3 refills | Status: DC

## 2020-09-16 MED ORDER — ALPRAZOLAM 0.5 MG PO TABS
0.5000 mg | ORAL_TABLET | Freq: Once | ORAL | Status: AC
  Administered 2020-09-16: 12:00:00 0.5 mg via ORAL
  Filled 2020-09-16: qty 1

## 2020-09-16 MED ORDER — IPRATROPIUM-ALBUTEROL 0.5-2.5 (3) MG/3ML IN SOLN: 3.0000 mL | Freq: Four times a day (QID) | RESPIRATORY_TRACT | Status: DC | PRN

## 2020-09-16 MED ORDER — MOMETASONE FURO-FORMOTEROL FUM 200-5 MCG/ACT IN AERO
2.0000 | INHALATION_SPRAY | Freq: Two times a day (BID) | RESPIRATORY_TRACT | Status: DC
  Administered 2020-09-16: 11:00:00 2 via RESPIRATORY_TRACT
  Filled 2020-09-16: qty 8.8

## 2020-09-16 MED ORDER — MONTELUKAST SODIUM 10 MG PO TABS: 10.0000 mg | ORAL_TABLET | Freq: Every day | ORAL | 3 refills | Status: AC

## 2020-09-16 MED ORDER — PREDNISONE 10 MG PO TABS: ORAL_TABLET | ORAL | 0 refills | Status: DC

## 2020-09-16 MED ORDER — KETOROLAC TROMETHAMINE 30 MG/ML IJ SOLN
30.0000 mg | Freq: Once | INTRAMUSCULAR | Status: AC
  Administered 2020-09-16: 12:00:00 30 mg via INTRAVENOUS
  Filled 2020-09-16: qty 1

## 2020-09-16 NOTE — Discharge Summary (Addendum)
Physician Discharge Summary  Angela Mckenzie IRC:[id number] DOB: 30-Sep-2000 DOA: 09/15/2020  PCP: Patient, No Pcp Per (Inactive)  Admit date: 09/15/2020 Discharge date: 09/16/2020  Time spent: 50 minutes  Recommendations for Outpatient Follow-up:  1. Follow up Pulmonology in 4 weeks   Discharge Diagnoses:  Active Problems:   Acute severe asthma   Discharge Condition:  Stable  Diet recommendation: Regular diet  Filed Weights   09/15/20 0229  Weight: 69.4 kg    History of present illness:  20 year old female with history of asthma presented with shortness of breath, cough with productive yellow phlegm.  Patient was noted with acute asthma exacerbation.  Hospital Course:   Asthma exacerbation-resolved, patient was seen by pulmonologist.  She was started on IV Solu-Medrol.  At this time patient is back to her baseline.  Not requiring oxygen.  She has been started on Dulera 2 puffs twice a day per pulmonology.  She has an appointment to see pulmonologist on 10/21/2020.  ?  Panic disorder-patient says that she has had 3 episodes of asthma in past 1 month.  She also has previous history of anxiety disorder.  Patient is not sure whether she is really having asthma attacks or develops panic attacks after taking too much albuterol.  She has been using albuterol 4 times a day.  I have recommended patient to follow-up with PCP for evaluation of panic disorder and may be starting an SSRI which should help her recurrent panic attacks.  Leukocytosis-WBC 21,000, she is afebrile.  Likely secondary to steroids.  Procedures:    Consultations:    Discharge Exam: Vitals:   09/16/20 0355 09/16/20 0730  BP: 120/69 113/65  Pulse: 73 89  Resp: 20 18  Temp: 98.6 F (37 C) 97.8 F (36.6 C)  SpO2: 99% 94%    General: Appears in no acute distress Cardiovascular: S1-S2, regular, no murmur auscultated Respiratory: Clear to auscultation bilaterally, no wheezing/crackles auscultated  Discharge  Instructions   Discharge Instructions    Diet - low sodium heart healthy   Complete by: As directed    Increase activity slowly   Complete by: As directed      Allergies as of 09/16/2020      Reactions   Remeron [mirtazapine]    Urine retention      Medication List    STOP taking these medications   doxycycline 100 MG capsule Commonly known as: VIBRAMYCIN   ipratropium-albuterol 0.5-2.5 (3) MG/3ML Soln Commonly known as: DUONEB   Junel FE 1.5/30 1.5-30 MG-MCG tablet Generic drug: norethindrone-ethinyl estradiol-iron     TAKE these medications   albuterol 108 (90 Base) MCG/ACT inhaler Commonly known as: VENTOLIN HFA Inhale 2 puffs into the lungs every 6 (six) hours as needed.   albuterol (2.5 MG/3ML) 0.083% nebulizer solution Commonly known as: PROVENTIL Take 3 mLs (2.5 mg total) by nebulization every 6 (six) hours as needed for wheezing or shortness of breath.   cetirizine 10 MG tablet Commonly known as: ZYRTEC Take 10 mg by mouth daily.   EPINEPHrine 0.3 mg/0.3 mL Soaj injection Commonly known as: EPI-PEN PLEASE SEE ATTACHED FOR DETAILED DIRECTIONS   mometasone-formoterol 200-5 MCG/ACT Aero Commonly known as: DULERA Inhale 2 puffs into the lungs 2 (two) times daily.   montelukast 10 MG tablet Commonly known as: SINGULAIR Take 1 tablet (10 mg total) by mouth at bedtime. What changed:   how much to take  how to take this  when to take this   norgestimate-ethinyl estradiol 0.25-35 MG-MCG tablet Commonly known  as: ORTHO-CYCLEN Take 1 tablet by mouth daily.   ondansetron 4 MG disintegrating tablet Commonly known as: Zofran ODT Take 1 tablet (4 mg total) by mouth every 8 (eight) hours as needed for nausea or vomiting.   predniSONE 10 MG tablet Commonly known as: DELTASONE Prednisone 40 mg po daily x 1 day then Prednisone 30 mg po daily x 1 day then Prednisone 20 mg po daily x 1 day then Prednisone 10 mg daily x 1 day then stop... Start taking on:  September 17, 2020 What changed:   medication strength  how much to take  how to take this  when to take this  additional instructions   valACYclovir 500 MG tablet Commonly known as: VALTREX Take 500 mg by mouth daily.      Allergies  Allergen Reactions  . Remeron [Mirtazapine]     Urine retention    Follow-up Information    Erin Fulling, MD Follow up on 10/21/2020.   Specialties: Pulmonary Disease, Cardiology Why: 1 : 30 PM Contact information: [address] [place] [address] [phone]                The results of significant diagnostics from this hospitalization (including imaging, microbiology, ancillary and laboratory) are listed below for reference.    Significant Diagnostic Studies: DG Chest Portable 1 View  Result Date: 09/15/2020 CLINICAL DATA:  Dyspnea EXAM: PORTABLE CHEST 1 VIEW COMPARISON:  09/07/2020 FINDINGS: The heart size and mediastinal contours are within normal limits. Both lungs are clear. The visualized skeletal structures are unremarkable. IMPRESSION: No active disease. Electronically Signed   By: Alcide Clever M.D.   On: 09/15/2020 02:47   DG Chest Port 1 View  Result Date: 09/07/2020 CLINICAL DATA:  Shortness of breath. EXAM: PORTABLE CHEST 1 VIEW COMPARISON:  August 31, 2020. FINDINGS: The heart size and mediastinal contours are within normal limits. Both lungs are clear. No pneumothorax or pleural effusion is noted. The visualized skeletal structures are unremarkable. IMPRESSION: No active disease. Electronically Signed   By: Lupita Raider M.D.   On: 09/07/2020 08:11   DG Chest Port 1 View  Result Date: 08/31/2020 CLINICAL DATA:  20 year old female with shortness of breath. Asthma attack. EXAM: PORTABLE CHEST 1 VIEW COMPARISON:  None. FINDINGS: Portable AP upright view at 0733 hours. Lung volumes are at the upper limits of normal. Normal cardiac size and mediastinal contours. Visualized tracheal air column is within  normal limits. Mildly coarse pulmonary perihilar and interstitial opacity. Otherwise when Allowing for portable technique the lungs are clear. No pneumothorax or pleural effusion. No osseous abnormality identified.  Negative visible bowel gas. IMPRESSION: Mildly coarse pulmonary perihilar and interstitial opacity, compatible with reactive airway disease in this setting. Consider also viral/atypical respiratory infection. Electronically Signed   By: Odessa Fleming M.D.   On: 08/31/2020 07:55    Microbiology: Recent Results (from the past 240 hour(s))  Resp Panel by RT-PCR (Flu A&B, Covid) Nasopharyngeal Swab     Status: None   Collection Time: 09/15/20  3:13 AM   Specimen: Nasopharyngeal Swab; Nasopharyngeal(NP) swabs in vial transport medium  Result Value Ref Range Status   SARS Coronavirus 2 by RT PCR NEGATIVE NEGATIVE Final    Comment: (NOTE) SARS-CoV-2 target nucleic acids are NOT DETECTED.  The SARS-CoV-2 RNA is generally detectable in upper respiratory specimens during the acute phase of infection. The lowest concentration of SARS-CoV-2 viral copies this assay can detect is 138 copies/mL. A negative result does  not preclude SARS-Cov-2 infection and should not be used as the sole basis for treatment or other patient management decisions. A negative result may occur with  improper specimen collection/handling, submission of specimen other than nasopharyngeal swab, presence of viral mutation(s) within the areas targeted by this assay, and inadequate number of viral copies(<138 copies/mL). A negative result must be combined with clinical observations, patient history, and epidemiological information. The expected result is Negative.  Fact Sheet for Patients:  BloggerCourse.com  Fact Sheet for Healthcare Providers:  SeriousBroker.it  This test is no t yet approved or cleared by the [place] FDA and  has been authorized for detection  and/or diagnosis of SARS-CoV-2 by FDA under an Emergency Use Authorization (EUA). This EUA will remain  in effect (meaning this test can be used) for the duration of the COVID-19 declaration under Section 564(b)(1) of the Act, 21 U.S.C.section 360bbb-3(b)(1), unless the authorization is terminated  or revoked sooner.       Influenza A by PCR NEGATIVE NEGATIVE Final   Influenza B by PCR NEGATIVE NEGATIVE Final    Comment: (NOTE) The Xpert Xpress SARS-CoV-2/FLU/RSV plus assay is intended as an aid in the diagnosis of influenza from Nasopharyngeal swab specimens and should not be used as a sole basis for treatment. Nasal washings and aspirates are unacceptable for Xpert Xpress SARS-CoV-2/FLU/RSV testing.  Fact Sheet for Patients: BloggerCourse.com  Fact Sheet for Healthcare Providers: SeriousBroker.it  This test is not yet approved or cleared by the [place] FDA and has been authorized for detection and/or diagnosis of SARS-CoV-2 by FDA under an Emergency Use Authorization (EUA). This EUA will remain in effect (meaning this test can be used) for the duration of the COVID-19 declaration under Section 564(b)(1) of the Act, 21 U.S.C. section 360bbb-3(b)(1), unless the authorization is terminated or revoked.  Performed at [facility], [address] [place], [place] [address]      Labs: Basic Metabolic Panel: Recent Labs  Lab 09/15/20 0231 09/15/20 0445  NA 138 138  K 4.0 3.4*  CL 104 106  CO2 27 22  GLUCOSE 115* 188*  BUN 16 13  CREATININE 0.63 0.61  CALCIUM 9.1 9.0  MG 2.3  --    Liver Function Tests: No results for input(s): AST, ALT, ALKPHOS, BILITOT, PROT, ALBUMIN in the last 168 hours. No results for input(s): LIPASE, AMYLASE in the last 168 hours. No results for input(s): AMMONIA in the last 168 hours. CBC: Recent Labs  Lab 09/15/20 0231 09/15/20 0445  WBC 16.7* 21.9*  NEUTROABS 8.1*  --    HGB 14.0 13.4  HCT 43.8 40.6  MCV 89.8 88.3  PLT 354 298      Signed:  [name] MD.  [facility] 09/16/2020, 1:33 PM

## 2020-09-16 NOTE — Plan of Care (Signed)
  Problem: Education: Goal: Knowledge of General Education information will improve Description Including pain rating scale, medication(s)/side effects and non-pharmacologic comfort measures Outcome: Progressing   

## 2020-09-16 NOTE — Progress Notes (Signed)
patientt is being discharged home to self care. Discharge instruction have been given, patient has been educated on new medications. IV has been removed. Friend will be her transportation home.

## 2020-09-16 NOTE — Progress Notes (Signed)
Per Dr Sharl Ma ok to dc tele and cont pulse ox

## 2020-10-21 ENCOUNTER — Ambulatory Visit (INDEPENDENT_AMBULATORY_CARE_PROVIDER_SITE_OTHER): Payer: Managed Care, Other (non HMO) | Admitting: Internal Medicine

## 2020-10-21 ENCOUNTER — Encounter: Payer: Self-pay | Admitting: Internal Medicine

## 2020-10-21 ENCOUNTER — Other Ambulatory Visit: Payer: Self-pay

## 2020-10-21 VITALS — BP 118/78 | HR 91 | Temp 98.0°F | Ht 66.0 in | Wt 156.6 lb

## 2020-10-21 DIAGNOSIS — Z720 Tobacco use: Secondary | ICD-10-CM

## 2020-10-21 DIAGNOSIS — F17210 Nicotine dependence, cigarettes, uncomplicated: Secondary | ICD-10-CM

## 2020-10-21 DIAGNOSIS — J4540 Moderate persistent asthma, uncomplicated: Secondary | ICD-10-CM

## 2020-10-21 MED ORDER — NICOTINE 21 MG/24HR TD PT24: 21.0000 mg | MEDICATED_PATCH | TRANSDERMAL | 1 refills | Status: AC

## 2020-10-21 NOTE — Patient Instructions (Addendum)
PLEASE STOP VAPING AVOID CATS START NICOTINE PATCH CONTINUE DULERA 2 puffs twice daily  ALBUTEROL AS NEEDED  Obtain PFT's

## 2020-10-21 NOTE — Progress Notes (Signed)
Name: Angela Mckenzie MRN: [id number] DOB: 2000-09-10     CONSULTATION DATE: 10/21/2020  REFERRING MD : hall  CHIEF COMPLAINT: Assessment for ASTHMA  STUDIES:  09/15/2020 CXR independently reviewed by Me today No acute pneumonia No effusions    HISTORY OF PRESENT ILLNESS: March 29,2022 Admitted for severe ASTHMA attack 20 y.o.femalewith medical history significant forasthma her whole life who presented to the emergency room with a severe acute asthma attack  with dyspnea as well as cough productive of yellowish-white sputum and wheezing   4-5 severe asthma attacks since JAN 2022 This is when she purchased a cat for a pet and she is allergic to everything cats and dogs. +exposure to cats and dogs  TOX screen +marijuana PREG test NEG +cat exposure +smoking and vaping exposures   Follow up assessment-patient still vapes, smokes marijuana, still exposed to cats Not using DULERA daily Patient is at high risk for recurrent exacerbation and high risk for COPD and lung cancer  PAST MEDICAL HISTORY :   has a past medical history of Asthma.  has a past surgical history that includes Breast lumpectomy and Wisdom tooth extraction. Prior to Admission medications   Medication Sig Start Date End Date Taking? Authorizing Provider  albuterol (PROVENTIL) (2.5 MG/3ML) 0.083% nebulizer solution Take 3 mLs (2.5 mg total) by nebulization every 6 (six) hours as needed for wheezing or shortness of breath. 09/07/20   [name], MD  albuterol (VENTOLIN HFA) 108 (90 Base) MCG/ACT inhaler Inhale 2 puffs into the lungs every 6 (six) hours as needed.    [provider]  cetirizine (ZYRTEC) 10 MG tablet Take 10 mg by mouth daily.    [provider]  EPINEPHrine 0.3 mg/0.3 mL IJ SOAJ injection PLEASE SEE ATTACHED FOR DETAILED DIRECTIONS Patient not taking: Reported on 09/15/2020 01/31/18   [provider]  mometasone-formoterol (DULERA) 200-5 MCG/ACT AERO Inhale 2 puffs  into the lungs 2 (two) times daily. 09/16/20   [name], MD  montelukast (SINGULAIR) 10 MG tablet Take 1 tablet (10 mg total) by mouth at bedtime. 09/16/20   [name], MD  norgestimate-ethinyl estradiol (ORTHO-CYCLEN) 0.25-35 MG-MCG tablet Take 1 tablet by mouth daily. 08/19/20   [provider]  ondansetron (ZOFRAN ODT) 4 MG disintegrating tablet Take 1 tablet (4 mg total) by mouth every 8 (eight) hours as needed for nausea or vomiting. Patient not taking: Reported on 09/15/2020 06/30/18   [name], DO  predniSONE (DELTASONE) 10 MG tablet Prednisone 40 mg po daily x 1 day then Prednisone 30 mg po daily x 1 day then Prednisone 20 mg po daily x 1 day then Prednisone 10 mg daily x 1 day then stop... 09/17/20   [name], MD  valACYclovir (VALTREX) 500 MG tablet Take 500 mg by mouth daily. Patient not taking: Reported on 09/15/2020 12/05/17   [provider]   Allergies  Allergen Reactions  . Remeron [Mirtazapine]     Urine retention    FAMILY HISTORY:  family history is not on file. SOCIAL HISTORY:  reports that she has been smoking cigarettes. She has been smoking about 0.25 packs per day. She has never used smokeless tobacco. She reports previous alcohol use. She reports current drug use. Drug: Marijuana.    Review of Systems:  Gen:  Denies  fever, sweats, chills weigh loss  HEENT: Denies blurred vision, double vision, ear pain, eye pain, hearing loss, nose bleeds, sore throat Cardiac:  No dizziness, chest pain or heaviness, chest  tightness,edema, No JVD Resp:   No cough, -sputum production, +shortness of breath,+wheezing, -hemoptysis,  Gi: Denies swallowing difficulty, stomach pain, nausea or vomiting, diarrhea, constipation, bowel incontinence Gu:  Denies bladder incontinence, burning urine Ext:   Denies Joint pain, stiffness or swelling Skin: Denies  skin rash, easy bruising or bleeding or hives Endoc:  Denies polyuria, polydipsia , polyphagia or weight  change Psych:   Denies depression, insomnia or hallucinations  Other:  All other systems negative    BP 118/78 (BP Location: Left Arm, Patient Position: Sitting, Cuff Size: Normal)   Pulse 91   Temp 98 F (36.7 C) (Temporal)   Ht 5\' 6"  (1.676 m)   Wt 156 lb 9.6 oz (71 kg)   SpO2 96%   BMI 25.28 kg/m    Physical Examination:   GENERAL:NAD, no fevers, chills, no weakness no fatigue HEAD: Normocephalic, atraumatic.  EYES: PERLA, EOMI No scleral icterus.  NECK: Supple.  PULMONARY: CTA B/L no wheezing, rhonchi, crackles CARDIOVASCULAR: S1 and S2. Regular rate and rhythm. No murmurs GASTROINTESTINAL: Soft, nontender, nondistended. Positive bowel sounds.  MUSCULOSKELETAL: No swelling, clubbing, or edema.  NEUROLOGIC: No gross focal neurological deficits. 5/5 strength all extremities SKIN: No ulceration, lesions, rashes, or cyanosis.  PSYCHIATRIC: Insight, judgment intact. -depression -anxiety ALL OTHER ROS ARE NEGATIVE   MEDICATIONS: I have reviewed all medications and confirmed regimen as documented      ASSESSMENT AND PLAN SYNOPSIS 20 year old pleasant white female seen today for follow-up assessment for moderate persistent asthma in the setting of ongoing vaping along with marijuana use with exposure to cats and dogs I explained to the patient that she will persistently have symptoms as long as she is exposed I have explained to her that she needs to make better choices in her lifestyle  Dulera 2 puffs twice daily was encouraged Albuterol as needed Avoidance of triggers No indication for antibiotics or prednisone at this time Will obtain pulmonary function testing to assess lung function   Patient is at high risk for recurrent infections and asthma attacks Patient is a high risk for getting COPD  All these findings were explained to the patient in detail   COVID-19 EDUCATION: The signs and symptoms of COVID-19 were discussed with the patient. Importance of hand  Hygiene and Social Distancing   MEDICATION ADJUSTMENTS/LABS AND TESTS ORDERED: PLEASE STOP VAPING AVOID CATS START NICOTINE PATCH CONTINUE DULERA 2 puffs twice daily  ALBUTEROL AS NEEDED Obtain PFT's CURRENT MEDICATIONS REVIEWED AT LENGTH WITH PATIENT TODAY   Patient/  satisfied with Plan of action and management. All questions answered  Follow up 6 months   Total time spent 34 mins  36, M.D.  [facility]  Medical Director [facility] Medical Director [facility] Cardio-Pulmonary Department

## 2020-10-28 ENCOUNTER — Telehealth: Payer: Self-pay

## 2020-10-28 NOTE — Telephone Encounter (Signed)
Lm for reminder of covid test prior to PFT.  10[facility]30/2020 at [facility]

## 2020-10-29 NOTE — Telephone Encounter (Signed)
Lm x2 for patient.  Will close encounter per office protocol.   

## 2020-10-30 ENCOUNTER — Other Ambulatory Visit
Admission: RE | Admit: 2020-10-30 | Discharge: 2020-10-30 | Disposition: A | Discharge status: Home / Self Care | Payer: Managed Care, Other (non HMO) | Source: Ambulatory Visit | Attending: Internal Medicine | Admitting: Internal Medicine

## 2020-10-30 ENCOUNTER — Other Ambulatory Visit: Payer: Self-pay

## 2020-10-30 DIAGNOSIS — Z01812 Encounter for preprocedural laboratory examination: Secondary | ICD-10-CM | POA: Diagnosis present

## 2020-10-30 DIAGNOSIS — Z20822 Contact with and (suspected) exposure to covid-19: Secondary | ICD-10-CM | POA: Diagnosis not present

## 2020-10-30 LAB — SARS CORONAVIRUS 2 (TAT 6-24 HRS): SARS Coronavirus 2: NEGATIVE

## 2020-11-02 ENCOUNTER — Other Ambulatory Visit: Payer: Self-pay

## 2020-11-02 ENCOUNTER — Ambulatory Visit: Payer: Managed Care, Other (non HMO) | Attending: Internal Medicine

## 2020-11-02 DIAGNOSIS — J4540 Moderate persistent asthma, uncomplicated: Secondary | ICD-10-CM | POA: Insufficient documentation

## 2020-11-02 MED ORDER — ALBUTEROL SULFATE (2.5 MG/3ML) 0.083% IN NEBU
2.5000 mg | INHALATION_SOLUTION | Freq: Once | RESPIRATORY_TRACT | Status: AC
  Administered 2020-11-02: 2.5 mg via RESPIRATORY_TRACT
  Filled 2020-11-02: qty 3

## 2021-06-17 ENCOUNTER — Encounter: Payer: Managed Care, Other (non HMO) | Admitting: Obstetrics

## 2021-07-16 ENCOUNTER — Other Ambulatory Visit
Admission: RE | Admit: 2021-07-16 | Discharge: 2021-07-16 | Disposition: A | Discharge status: Home / Self Care | Payer: BC Managed Care – PPO | Source: Ambulatory Visit | Attending: Student | Admitting: Student

## 2021-07-16 DIAGNOSIS — R0602 Shortness of breath: Secondary | ICD-10-CM | POA: Diagnosis not present

## 2021-07-16 DIAGNOSIS — R079 Chest pain, unspecified: Secondary | ICD-10-CM | POA: Insufficient documentation

## 2021-07-16 DIAGNOSIS — Z03818 Encounter for observation for suspected exposure to other biological agents ruled out: Secondary | ICD-10-CM | POA: Insufficient documentation

## 2021-07-16 LAB — D-DIMER, QUANTITATIVE: D-Dimer, Quant: <0.27 ug/mL-FEU (ref 0.00–0.50)

## 2021-10-26 ENCOUNTER — Other Ambulatory Visit: Payer: Self-pay

## 2021-10-26 ENCOUNTER — Emergency Department: Payer: BC Managed Care – PPO

## 2021-10-26 ENCOUNTER — Encounter: Payer: Self-pay | Admitting: Emergency Medicine

## 2021-10-26 ENCOUNTER — Emergency Department
Admission: EM | Admit: 2021-10-26 | Discharge: 2021-10-26 | Disposition: A | Discharge status: Home / Self Care | Payer: BC Managed Care – PPO | Attending: Emergency Medicine | Admitting: Emergency Medicine

## 2021-10-26 DIAGNOSIS — J039 Acute tonsillitis, unspecified: Secondary | ICD-10-CM | POA: Diagnosis not present

## 2021-10-26 DIAGNOSIS — R509 Fever, unspecified: Secondary | ICD-10-CM | POA: Diagnosis present

## 2021-10-26 DIAGNOSIS — D72829 Elevated white blood cell count, unspecified: Secondary | ICD-10-CM | POA: Insufficient documentation

## 2021-10-26 DIAGNOSIS — M545 Low back pain, unspecified: Secondary | ICD-10-CM | POA: Diagnosis not present

## 2021-10-26 DIAGNOSIS — R1031 Right lower quadrant pain: Secondary | ICD-10-CM | POA: Insufficient documentation

## 2021-10-26 DIAGNOSIS — Z20822 Contact with and (suspected) exposure to covid-19: Secondary | ICD-10-CM | POA: Diagnosis not present

## 2021-10-26 LAB — COMPREHENSIVE METABOLIC PANEL
ALT: 14 U/L (ref 0–44)
AST: 13 U/L — ABNORMAL LOW (ref 15–41)
Albumin: 4.5 g/dL (ref 3.5–5.0)
Alkaline Phosphatase: 55 U/L (ref 38–126)
Anion gap: 9 (ref 5–15)
BUN: 13 mg/dL (ref 6–20)
CO2: 21 mmol/L — ABNORMAL LOW (ref 22–32)
Calcium: 9.4 mg/dL (ref 8.9–10.3)
Chloride: 104 mmol/L (ref 98–111)
Creatinine, Ser: 0.5 mg/dL (ref 0.44–1.00)
GFR, Estimated: >60 mL/min (ref >60)
Glucose, Bld: 109 mg/dL — ABNORMAL HIGH (ref 70–99)
Potassium: 3.8 mmol/L (ref 3.5–5.1)
Sodium: 134 mmol/L — ABNORMAL LOW (ref 135–145)
Total Bilirubin: 1 mg/dL (ref 0.3–1.2)
Total Protein: 7.7 g/dL (ref 6.5–8.1)

## 2021-10-26 LAB — CHLAMYDIA/NGC RT PCR (ARMC ONLY)
Chlamydia Tr: NOT DETECTED
N gonorrhoeae: NOT DETECTED

## 2021-10-26 LAB — RESP PANEL BY RT-PCR (FLU A&B, COVID) ARPGX2
Influenza A by PCR: NEGATIVE
Influenza B by PCR: NEGATIVE
SARS Coronavirus 2 by RT PCR: NEGATIVE

## 2021-10-26 LAB — WET PREP, GENITAL
Clue Cells Wet Prep HPF POC: NONE SEEN
Sperm: NONE SEEN
Trich, Wet Prep: NONE SEEN
WBC, Wet Prep HPF POC: <10 (ref <10)
Yeast Wet Prep HPF POC: NONE SEEN

## 2021-10-26 LAB — LIPASE, BLOOD: Lipase: 29 U/L (ref 11–51)

## 2021-10-26 MED ORDER — ONDANSETRON HCL 4 MG/2ML IJ SOLN
4.0000 mg | Freq: Once | INTRAMUSCULAR | Status: AC
  Administered 2021-10-26: 4 mg via INTRAVENOUS
  Filled 2021-10-26: qty 2

## 2021-10-26 MED ORDER — PREDNISONE 10 MG (21) PO TBPK: ORAL_TABLET | ORAL | 0 refills | Status: DC

## 2021-10-26 MED ORDER — AMOXICILLIN-POT CLAVULANATE 875-125 MG PO TABS: 1.0000 | ORAL_TABLET | Freq: Two times a day (BID) | ORAL | 0 refills | Status: AC

## 2021-10-26 MED ORDER — KETOROLAC TROMETHAMINE 15 MG/ML IJ SOLN
15.0000 mg | Freq: Once | INTRAMUSCULAR | Status: AC
  Administered 2021-10-26: 15 mg via INTRAVENOUS
  Filled 2021-10-26: qty 1

## 2021-10-26 MED ORDER — DEXAMETHASONE 4 MG PO TABS
10.0000 mg | ORAL_TABLET | Freq: Once | ORAL | Status: AC
  Administered 2021-10-26: 10 mg via ORAL
  Filled 2021-10-26: qty 3

## 2021-10-26 MED ORDER — SODIUM CHLORIDE 0.9 % IV SOLN
2.0000 g | Freq: Once | INTRAVENOUS | Status: AC
  Administered 2021-10-26: 2 g via INTRAVENOUS
  Filled 2021-10-26: qty 20

## 2021-10-26 MED ORDER — IOHEXOL 300 MG/ML  SOLN
100.0000 mL | Freq: Once | INTRAMUSCULAR | Status: AC | PRN
  Administered 2021-10-26: 100 mL via INTRAVENOUS
  Filled 2021-10-26: qty 100

## 2021-10-26 MED ORDER — SODIUM CHLORIDE 0.9 % IV BOLUS
1000.0000 mL | Freq: Once | INTRAVENOUS | Status: AC
  Administered 2021-10-26: 1000 mL via INTRAVENOUS

## 2021-10-26 MED ORDER — LIDOCAINE 5 % EX PTCH
1.0000 | MEDICATED_PATCH | Freq: Once | CUTANEOUS | Status: DC
  Administered 2021-10-26: 1 via TRANSDERMAL
  Filled 2021-10-26: qty 1

## 2021-10-26 MED ORDER — AMOXICILLIN-POT CLAVULANATE 875-125 MG PO TABS: 1.0000 | ORAL_TABLET | Freq: Two times a day (BID) | ORAL | 0 refills | Status: DC

## 2021-10-26 MED ORDER — FENTANYL CITRATE PF 50 MCG/ML IJ SOSY
50.0000 ug | PREFILLED_SYRINGE | Freq: Once | INTRAMUSCULAR | Status: AC
  Administered 2021-10-26: 50 ug via INTRAVENOUS
  Filled 2021-10-26: qty 1

## 2021-10-26 NOTE — ED Triage Notes (Addendum)
Pt states that she had sudden onset of throat pain and lower back pain yesterday evening, pt states that she has been running  a low grade fever since, pt was seen at [facility] who sent her here for a ct. Pt states that her wbc was 23 there, her strep and mono screens were negative and that her urine had some bacteria in it, pt was given a toradol injection that helped with her throat pain not her back pain. Pt states that she is having lower abd pain worse on the rlq ? ?Last dose of tylenol and motrin were at 0730 ?

## 2021-10-26 NOTE — ED Provider Notes (Signed)
Patient signed out to pending ultrasound of the pelvis.  21 year old female presenting with leukocytosis fever body aches sore throat from urgent care.  CT abdomen pelvis here shows right-sided ovarian cyst but her pain is on the left side her other labs are overall reassuring.  Ultrasound was ordered to further evaluate right-sided ovarian cyst measuring 6 cm on CT. ? ?Ultrasound shows likely right hemorrhagic cyst without evidence of torsion.  Recommending 62-month follow-up.  Discussed this with the patient.  She was given a dose of Rocephin in the ED and blood cultures were sent.  We discussed return precautions.  I have ordered her a dose of Toradol prior to being discharged. ?  ?Georga Hacking, MD ?10/26/21 1622 ? ?

## 2021-10-26 NOTE — Discharge Instructions (Addendum)
We are starting you on some medications for possible tonsillitis and some steroids to help prevent inflammation.  Return to the ER if develop worsening symptoms or any other concerns.  There is also she has a could just be viral and needs some time.  If you develop worsening neck pain or confusion return to the ER immediately for further work-up ? ?If you develop severe pain in your right lower quadrant you to return to the ER immediately as this could be a sign of your ovary twisting acutely tubo-ovarian death.  The ultrasound of your ovaries shows a likely hemorrhagic cyst on the right side.  You should have a repeat ultrasound done in about 3 months to further evaluate this. ? ? ?IMPRESSION: ?1. No acute abdominal or pelvic pathology. ?2. A 6.6 cm right ovarian simple-appearing cyst. Recommend follow-up ?pelvic US in 3-6 months. ?Reference: JACR 2020 Feb;17(2):248-254. If there is further clinical ?concern regarding ovarian torsion, recommend a pelvic ultrasound ?with Doppler. ?

## 2021-10-26 NOTE — ED Provider Notes (Addendum)
? ?[facility] ?Provider Note ? ? ? Event Date/Time  ? First MD Initiated Contact with Patient 10/26/21 1216   ?  (approximate) ? ? ?History  ? ?Fever, Sore Throat, Abdominal Pain, and Back Pain ? ? ?HPI ? ?Angela Mckenzie is a 21 y.o. female otherwise healthy who comes in from a [facility] clinic for CT imaging.  Patient reports having symptoms started yesterday including left low back pain as well as some throat pain.  She has no changes in her voice, full range of motion of neck and reports that her throat feels better after Toradol however given she had a white count that was significantly elevated they recommended that she come to the emergency room for CT imaging given some left back pain as well as right lower quadrant pain.  She does report history of cyst.  She does report taking Tylenol, Motrin at 730.  She denies any IV drug use, tick bites, new skin rashes, steroid use.  Patient reports no concerns for STDs.  She reports having testing with her current partner and had negative work-up. She denies any new sexual partners, or vaginal discharge.  Reports prior STD testing with current partner.  ? ? ?On review of records patient was seen in the office visit for internal medicine and given she had an elevated white count sent over here for further work-up.  Her urine was negative for UTI only showed a few bacteria.  Her white count was 23,000 and pregnancy test was negative.  Monotest was negative.  Strep test was negative ? ?Physical Exam  ? ?Triage Vital Signs: ?ED Triage Vitals  ?Enc Vitals Group  ?   BP 10/26/21 1135 105/79  ?   Pulse Rate 10/26/21 1135 (!) 103  ?   Resp 10/26/21 1135 16  ?   Temp 10/26/21 1135 99.4 ?F (37.4 ?C)  ?   Temp Source 10/26/21 1135 Oral  ?   SpO2 10/26/21 1135 99 %  ?   Weight 10/26/21 1136 155 lb (70.3 kg)  ?   Height 10/26/21 1136 5\' 6"  (1.676 m)  ?   Head Circumference --   ?   Peak Flow --   ?   Pain Score 10/26/21 1136 7  ?   Pain Loc --   ?   Pain Edu? --   ?    Excl. in GC? --   ? ? ?Most recent vital signs: ?Vitals:  ? 10/26/21 1135  ?BP: 105/79  ?Pulse: (!) 103  ?Resp: 16  ?Temp: 99.4 ?F (37.4 ?C)  ?SpO2: 99%  ? ? ? ?General: Awake, no distress.  ?CV:  Good peripheral perfusion.  ?Resp:  Normal effort.  ?Abd:  No distention.  Some left CVA tenderness but no midline tenderness in the back- no right lower quadrant pain type ?Other:  Patient has no muffled voice speaking in full sentences with full range of motion of neck.  She has erythema in her oropharynx but no peritonsillar exudates.  Uvula is midline. ? ? ?ED Results / Procedures / Treatments  ? ?Labs ?(all labs ordered are listed, but only abnormal results are displayed) ?Labs Reviewed  ?COMPREHENSIVE METABOLIC PANEL - Abnormal; Notable for the following components:  ?    Result Value  ? Sodium 134 (*)   ? CO2 21 (*)   ? Glucose, Bld 109 (*)   ? AST 13 (*)   ? All other components within normal limits  ?RESP PANEL BY RT-PCR (FLU A&B,  COVID) ARPGX2  ?LIPASE, BLOOD  ? ? ? ? ?RADIOLOGY ?I have reviewed the CT personally R ovarian cyst.  ? ? ?PROCEDURES: ? ?Critical Care performed: No ? ?Procedures ? ? ?MEDICATIONS ORDERED IN ED: ?Medications  ?lidocaine (LIDODERM) 5 % 1 patch (1 patch Transdermal Patch Applied 10/26/21 1236)  ?sodium chloride 0.9 % bolus 1,000 mL (has no administration in time range)  ? ? ? ?IMPRESSION / MDM / ASSESSMENT AND PLAN / ED COURSE  ?I reviewed the triage vital signs and the nursing notes. ? ?Patient comes in with elevated white count.  Patient is otherwise very well-appearing.  She has no evidence of retropharyngeal abscess or peritonsillar abscess based on examination.  Mono and strep were negative.  Will test for COVID, flu as well as get CT imaging to rule out appendicitis, kidney stone, diverticulitis or other acute pathology.  Will get chest x-ray to evaluate for any pneumonia.  We will give patient some IV fluids and lidocaine patch to help with pain.  UA without evidence of  UTI. ? ?Lipase negative.  CMP slightly low sodium and bicarb patient is getting some IV fluids. ? ?Chest x-ray negative for pneumonia.  CT imaging negative for appendicitis.  Patient does have right ovarian cyst so we will get ultrasound to make sure evidence of torsion but her does not really explain her fever so seems less likely.  I offered pelvic exam to evaluate for STDs but she declined and denies any vaginal discharge however given still no source of fever we will have her self swab ? ?Patient reports some continued left flank pain but is mostly with movement.  Sounds more musculoskeletal in nature.  She has no midline back tenderness to suggest epidural abscess no history of IV drug use.  She does report the sore throat coming back and I suspect that she could have tonsillitis.  Patient is otherwise very well-appearing and I have low suspicion for meningitis, bacteremia.  I have ordered blood cultures and given a dose of ceftriaxone while these are pending but given she is well-appearing I think it be reasonable for her to go home and come back if blood cultures are positive. ? ?Patient handed off to oncoming team pending transvaginal ultrasound but suspect discharge with treatment for possible tonsillitis with Augmentin as well as a steroid course to help prevent rebound throat pain.  On my examination she does not only have any right lower quadrant pain and the majority of pain is in her left flank and seems more musculoskeletal in nature.  We discussed the possibility of ovarian torsion but does not really sound like that at this time.  We discussed the importance of coming back if she develops right lower quadrant pain.  Her abdomen is soft and nontender in the right lower quadrant at this time. ? ? ? ? ?FINAL CLINICAL IMPRESSION(S) / ED DIAGNOSES  ? ?Final diagnoses:  ?Tonsillitis  ?Fever, unspecified fever cause  ? ? ? ?Rx / DC Orders  ? ?ED Discharge Orders   ? ?      Ordered  ?   amoxicillin-clavulanate (AUGMENTIN) 875-125 MG tablet  2 times daily       ? 10/26/21 1441  ?  predniSONE (STERAPRED UNI-PAK 21 TAB) 10 MG (21) TBPK tablet       ? 10/26/21 1441  ? ?  ?  ? ?  ? ? ? ?Note:  This document was prepared using Dragon voice recognition software and may include unintentional dictation  errors. ?  ?Concha SeFunke, Roth Ress E, MD ?10/26/21 1458 ? ?  ?Concha SeFunke, Cayci Mcnabb E, MD ?10/26/21 1509 ? ?  ?Concha SeFunke, Kiel Cockerell E, MD ?10/26/21 1514 ? ?  ?Concha SeFunke, Vanessa Alesi E, MD ?10/27/21 0700 ? ?

## 2021-10-31 LAB — CULTURE, BLOOD (ROUTINE X 2)
Culture: NO GROWTH
Special Requests: ADEQUATE

## 2022-03-28 IMAGING — DX DG CHEST 1V PORT
1 series · 1 of 1 positions shown · non-contrast
Comparison: August 31, 2020.

CLINICAL DATA: Shortness of breath.

EXAM:
PORTABLE CHEST 1 VIEW

[chest ap]
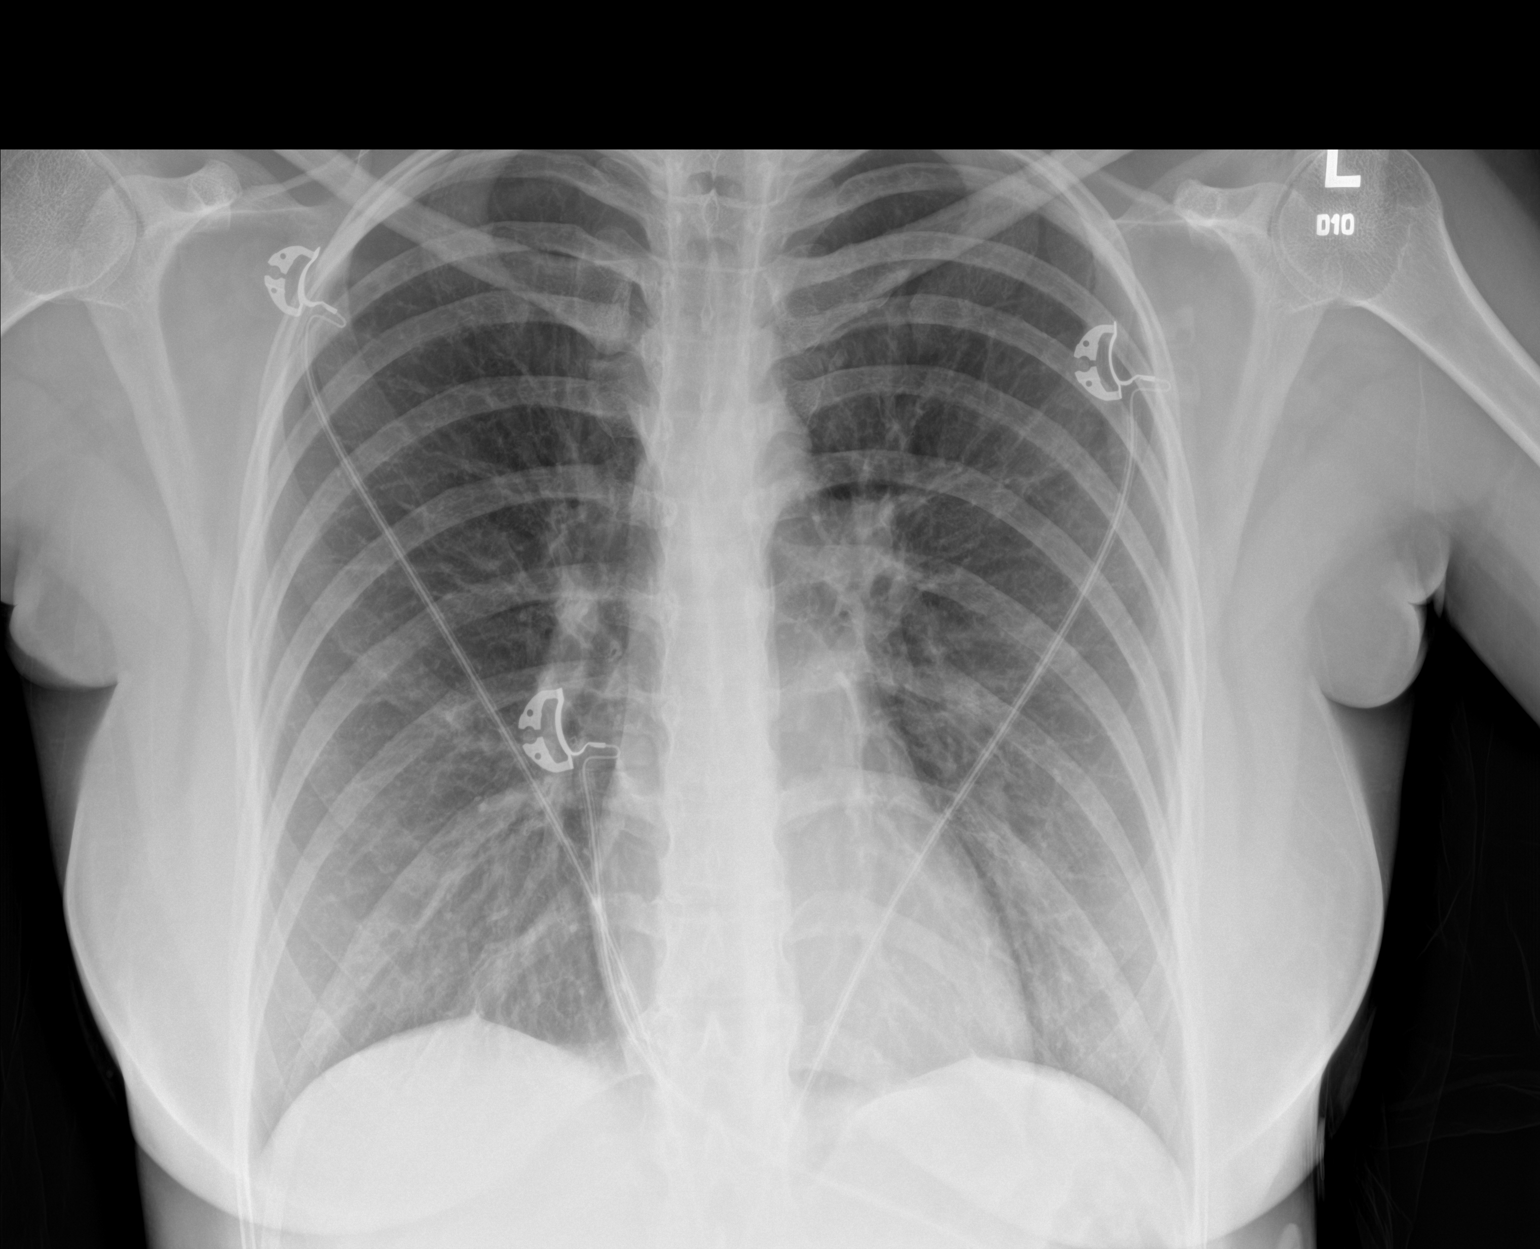

[1 of 1 positions shown; findings below may reference images not displayed]

FINDINGS: The heart size and mediastinal contours are within normal limits.
Both lungs are clear. No pneumothorax or pleural effusion is noted.
The visualized skeletal structures are unremarkable.
IMPRESSION: No active disease.

## 2022-05-30 ENCOUNTER — Ambulatory Visit (LOCAL_COMMUNITY_HEALTH_CENTER): Payer: BC Managed Care – PPO

## 2022-05-30 DIAGNOSIS — Z23 Encounter for immunization: Secondary | ICD-10-CM | POA: Diagnosis not present

## 2022-05-30 DIAGNOSIS — Z719 Counseling, unspecified: Secondary | ICD-10-CM

## 2022-05-30 NOTE — Progress Notes (Signed)
Patient seen in nurse clinic for Hep B vaccine.  Titer completed 05/16/2022 (in EPIC) by PCP did not show immunity. 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  Heplisav B given IM in right deltoid. Tolerated well. VIS provided. [facility] updated and 2 copies provided.  Discussed patient to have titer done after 4 weeks from today and based on results - will determine if another Heplisav B needed.

## 2022-06-29 ENCOUNTER — Ambulatory Visit (LOCAL_COMMUNITY_HEALTH_CENTER): Payer: Self-pay

## 2022-06-29 DIAGNOSIS — Z0184 Encounter for antibody response examination: Secondary | ICD-10-CM

## 2022-06-29 NOTE — Progress Notes (Signed)
In nurse clinic for repeat Hep B titer quantitative. Needed for nursing school.   05/16/22 Hep B titer low (<3.1) 05/30/22 Heplisav B vaccine given.  Here today for repeat hep B titer.  RN explained that  (per [facility] Dr Vertell Novak) if repeat titer results are low, it is recommended to complete Hep B vaccine series. If repeat titer results show immunity, no further Hep B vaccines are indicated. Voices understanding.  ROI signed. Pt has MyChart and plans to view results there. RN explained that [facility] RN will contact her with results via phone and a copy of results will be made available to her if needed. Voices understanding.   Vaccines from Epic record placed into [facility]. Updated [facility] copy given.   RN walked patient to lab.  Josie Saunders, RN

## 2022-06-30 LAB — HEPATITIS B SURFACE ANTIBODY, QUANTITATIVE: Hepatitis B Surf Ab Quant: >1000 m[IU]/mL (ref >9.9)

## 2022-07-01 NOTE — Progress Notes (Signed)
Reviewed. Repeat Hep B titer showing immunity.  Per standing order Dr Vertell Novak, no further Hep B vaccines indicated.  Phone call to patient and RN explained this to her. Voices understanding. Patient states she was able to view results on MyChart and does not need a paper result copy at this time. Josie Saunders, RN

## 2023-05-16 IMAGING — CT CT ABD-PELV W/ CM
2 of 4 series · 16 of 46 positions shown, 18 images · IV contrast (APPLIED)
Comparison: None Available.

CLINICAL DATA: Abdominal pain.

EXAM:
CT ABDOMEN AND PELVIS WITH CONTRAST
TECHNIQUE: Multidetector CT imaging of the abdomen and pelvis was performed
using the standard protocol following bolus administration of
intravenous contrast.

[Series 2: abdomen 5.0 · axial · 0.63mm/px · z∈[-1008,-583]mm · 13 of 97 slices shown, 15 images]
[im 6/97  soft-tissue]
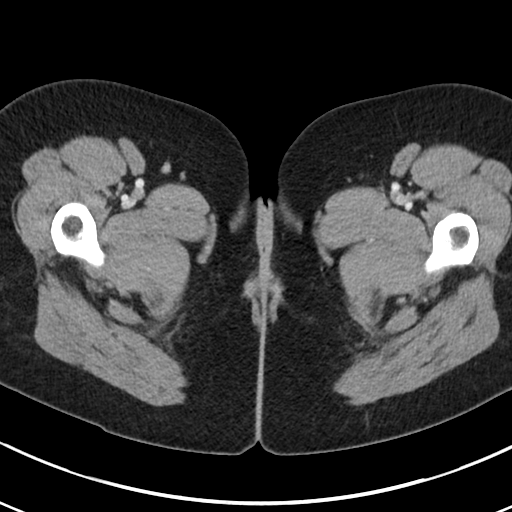
[im 6/97  bone]
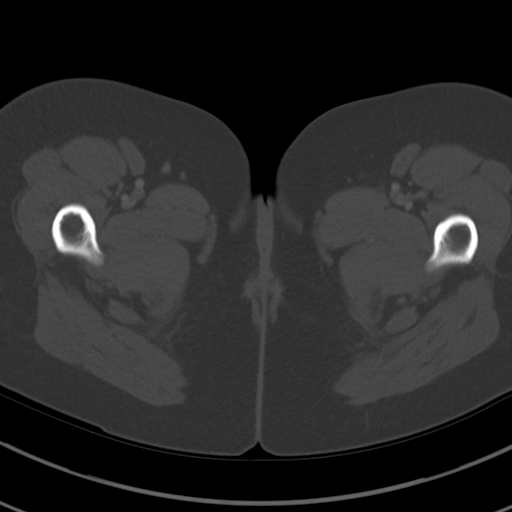
[im 16/97  soft-tissue]
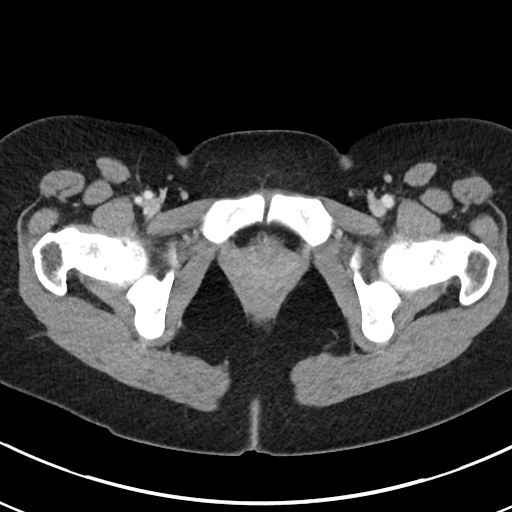
[im 21/97  soft-tissue]
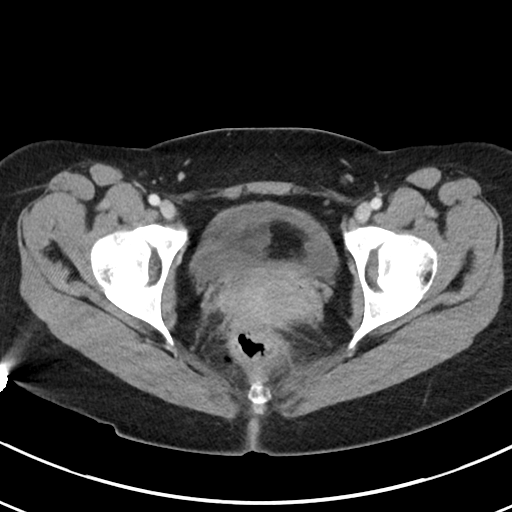
[im 26/97  soft-tissue]
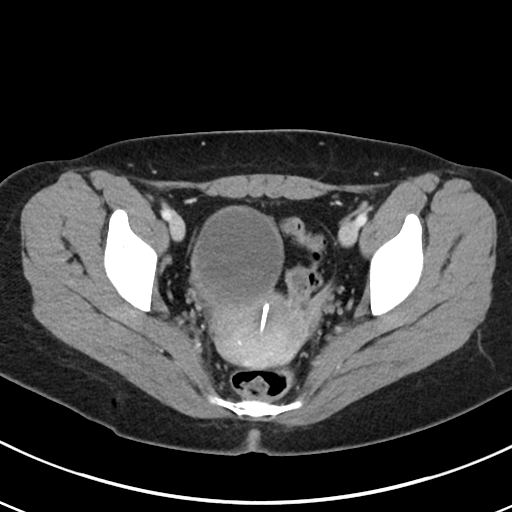
[im 36/97  soft-tissue]
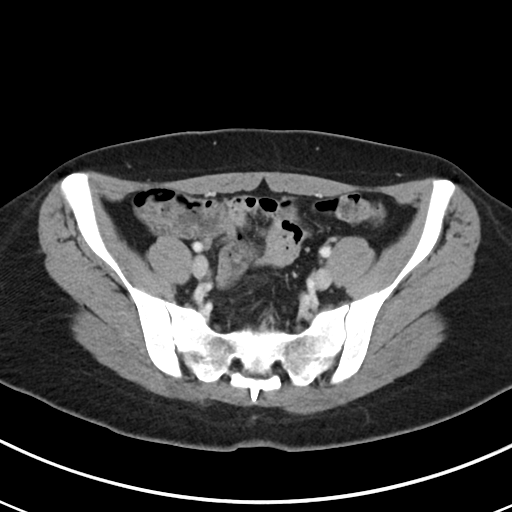
[im 41/97  soft-tissue]
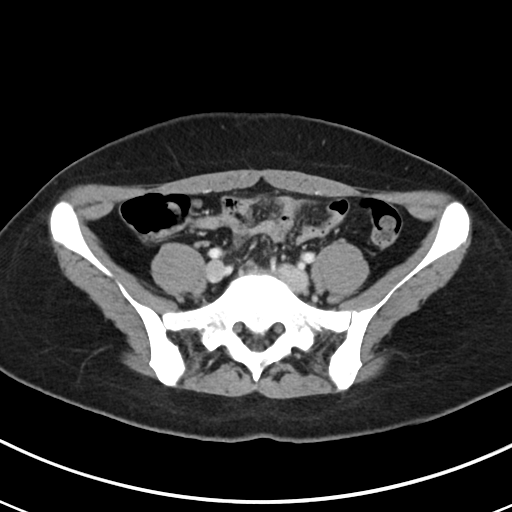
[im 51/97  soft-tissue]
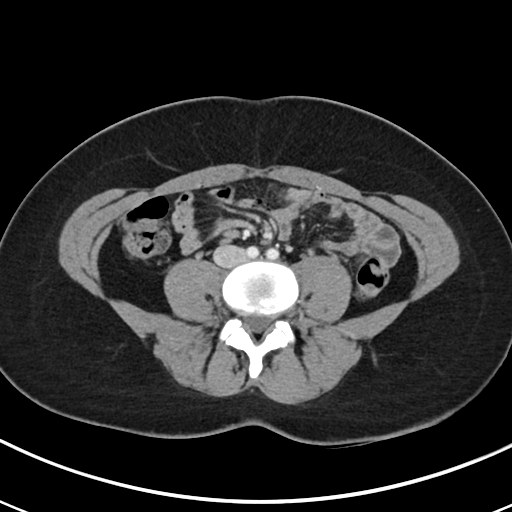
[im 56/97  soft-tissue]
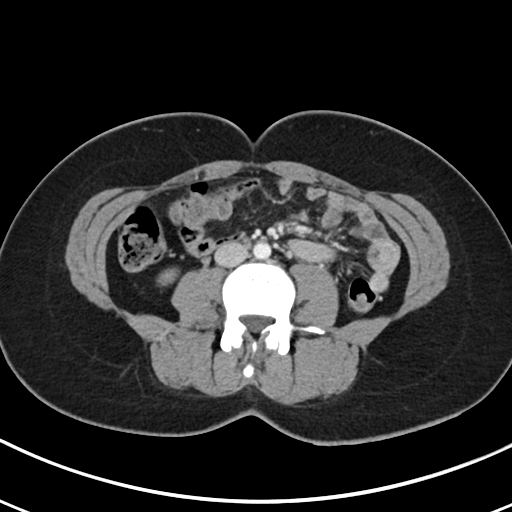
[im 61/97  soft-tissue]
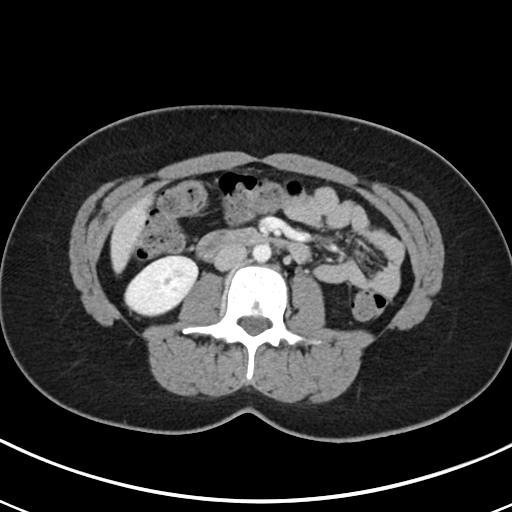
[im 61/97  bone]
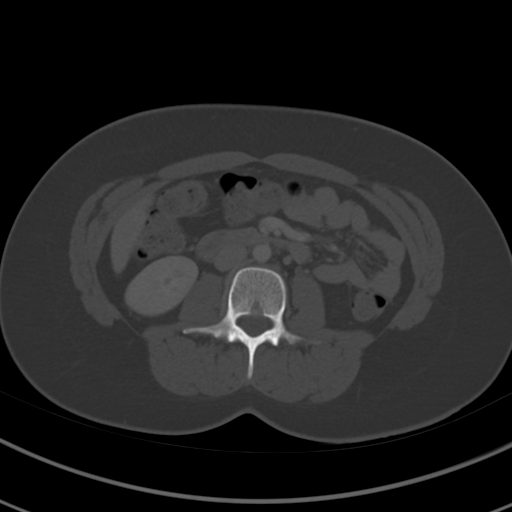
[im 71/97  soft-tissue]
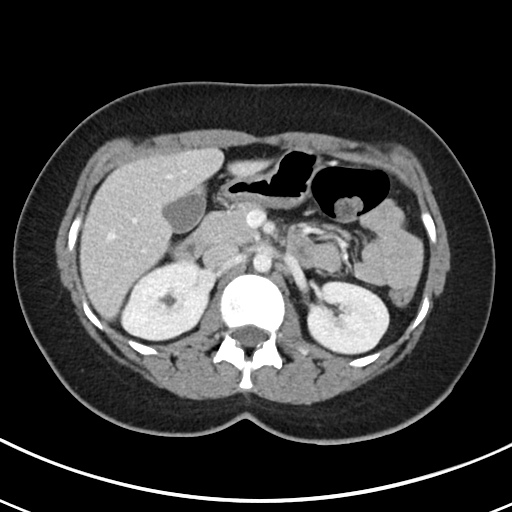
[im 76/97  soft-tissue]
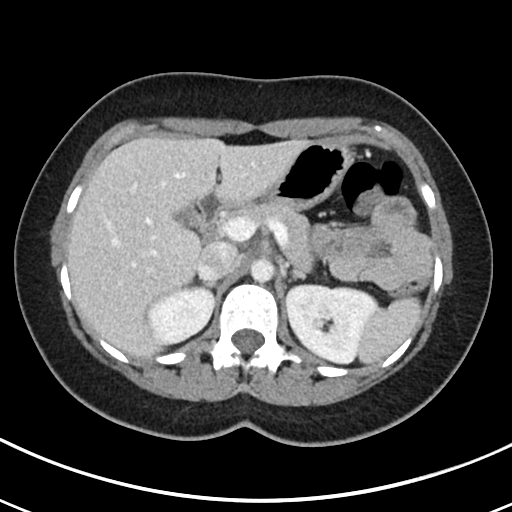
[im 81/97  soft-tissue]
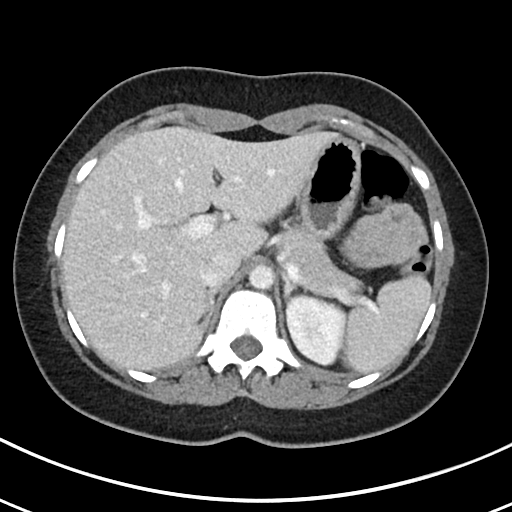
[im 91/97  soft-tissue]
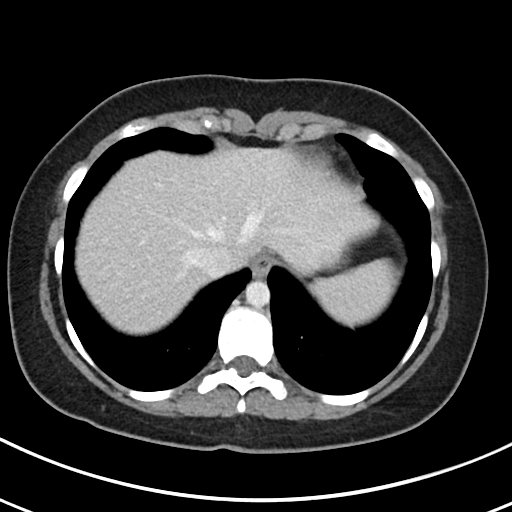

[Series 4: abdomen 3.0 mpr cor · coronal · 0.82mm/px · 3 of 84 slices shown]
[im 28/84  soft-tissue]
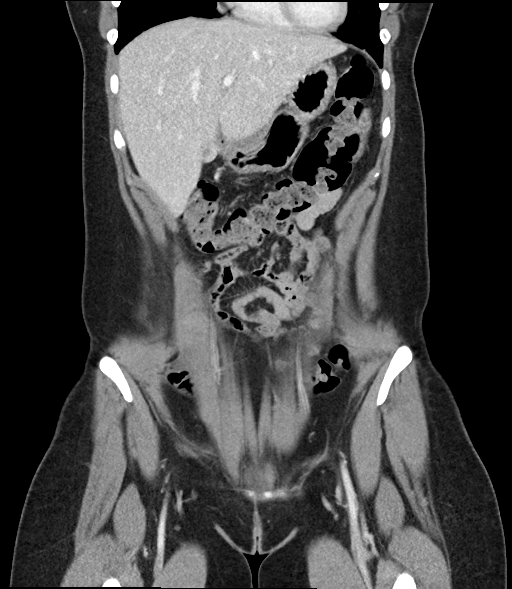
[im 37/84  soft-tissue]
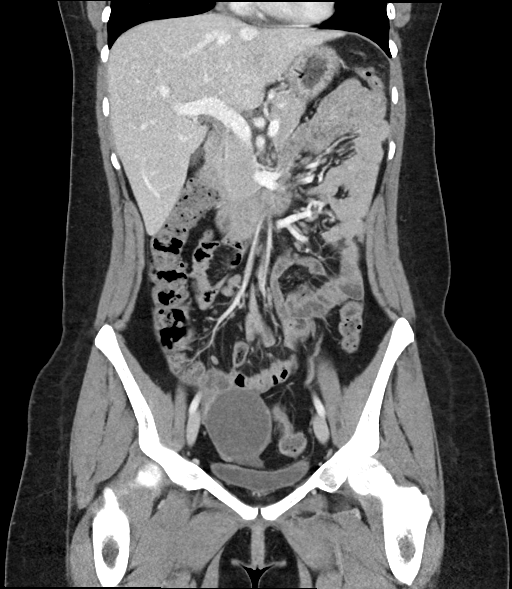
[im 47/84  soft-tissue]
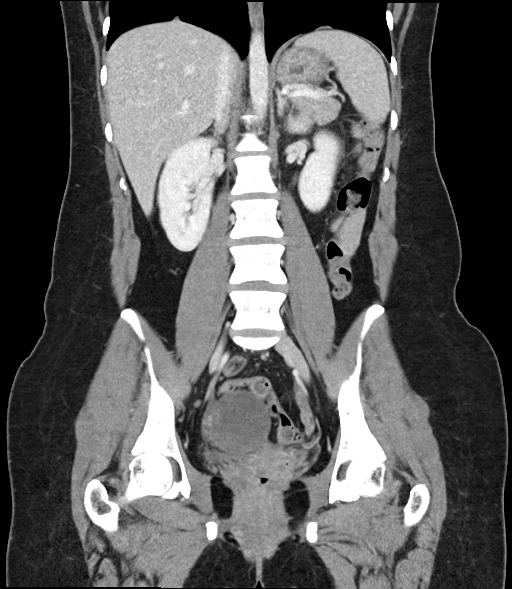

[16 of 46 positions shown; findings below may reference images not displayed]

RADIATION DOSE REDUCTION: This exam was performed according to the
departmental dose-optimization program which includes automated
exposure control, adjustment of the mA and/or kV according to
patient size and/or use of iterative reconstruction technique.

CONTRAST:  100mL OMNIPAQUE IOHEXOL 300 MG/ML  SOLN
FINDINGS: Lower chest: No acute abnormality.

Hepatobiliary: No focal liver abnormality is seen. No gallstones,
gallbladder wall thickening, or biliary dilatation.

Pancreas: Unremarkable. No pancreatic ductal dilatation or
surrounding inflammatory changes.

Spleen: Normal in size without focal abnormality.

Adrenals/Urinary Tract: Adrenal glands are unremarkable. Kidneys are
normal, without renal calculi, focal lesion, or hydronephrosis.
Bladder is unremarkable.

Stomach/Bowel: Stomach is within normal limits. No evidence of bowel
wall thickening, distention, or inflammatory changes. Appendix is
normal.

Vascular/Lymphatic: No significant vascular findings are present. No
enlarged abdominal or pelvic lymph nodes.

Reproductive: Normal uterus. T-type IUD within the uterus. 6.6 x
cm cystic right ovarian mass.

Other: No abdominal wall hernia or abnormality. No abdominopelvic
ascites.

Musculoskeletal: No acute osseous abnormality. No aggressive osseous
lesion.
IMPRESSION: 1. No acute abdominal or pelvic pathology.
2. A 6.6 cm right ovarian simple-appearing cyst. Recommend follow-up
pelvic US in 3-6 months.
Reference: JACR [DATE]):[id number]. If there is further clinical
concern regarding ovarian torsion, recommend a pelvic ultrasound
with Doppler.

## 2023-05-16 IMAGING — US US PELVIS COMPLETE TRANSABD/TRANSVAG W DUPLEX AND/OR DOPPLER
1 series · 13 of 25 positions shown · non-contrast
Comparison: CT abdomen and pelvis 10/26/2021, earlier same day

CLINICAL DATA: Right lower quadrant pain for weeks. Unknown last
menstrual period.

EXAM:
TRANSABDOMINAL AND TRANSVAGINAL ULTRASOUND OF PELVIS
DOPPLER ULTRASOUND OF OVARIES
TECHNIQUE: Both transabdominal and transvaginal ultrasound examinations of the
pelvis were performed. Transabdominal technique was performed for
global imaging of the pelvis including uterus, ovaries, adnexal
regions, and pelvic cul-de-sac.
It was necessary to proceed with endovaginal exam following the
transabdominal exam to visualize the uterus, endometrium, ovaries,
and adnexae. Color and duplex Doppler ultrasound was utilized to
evaluate blood flow to the ovaries.

[Series 1: us pelvic complete w transvaginal and torsion righ · 13 of 155 slices shown]
[im 1/155]
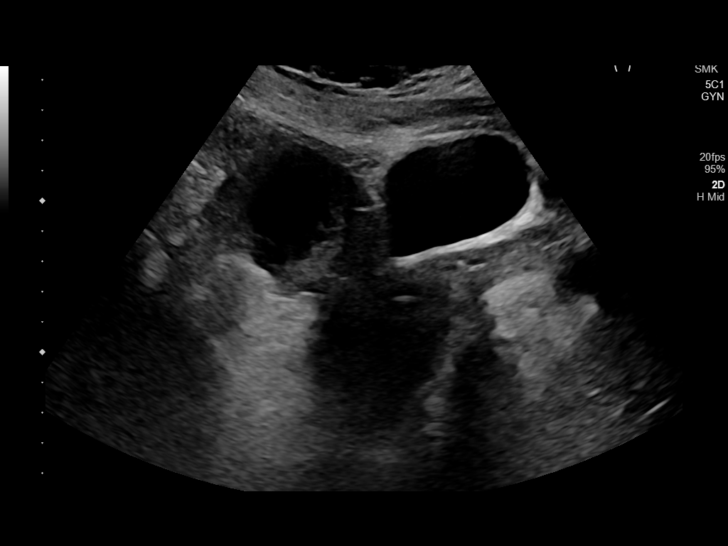
[im 13/155]
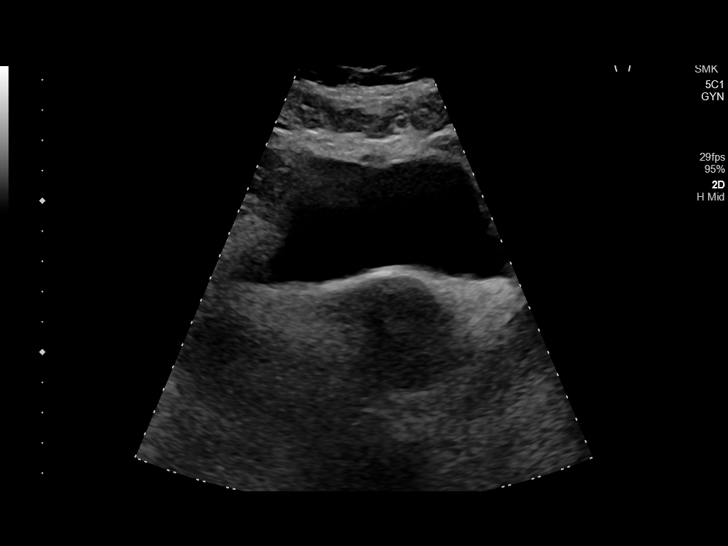
[im 26/155]
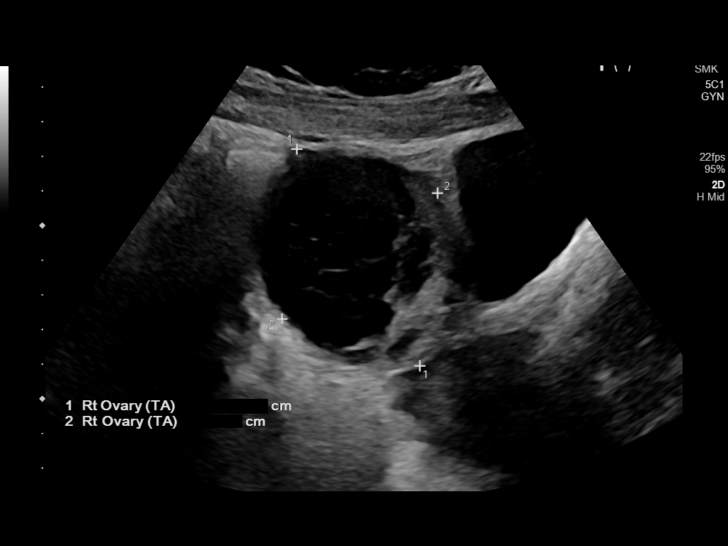
[im 39/155]
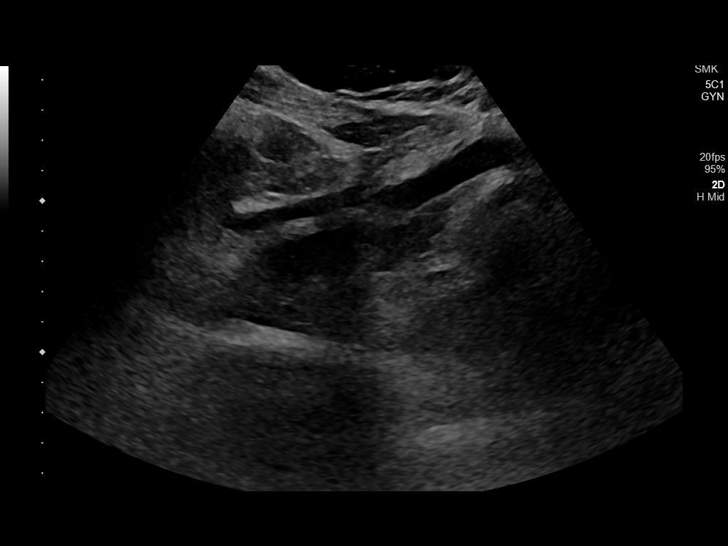
[im 52/155]
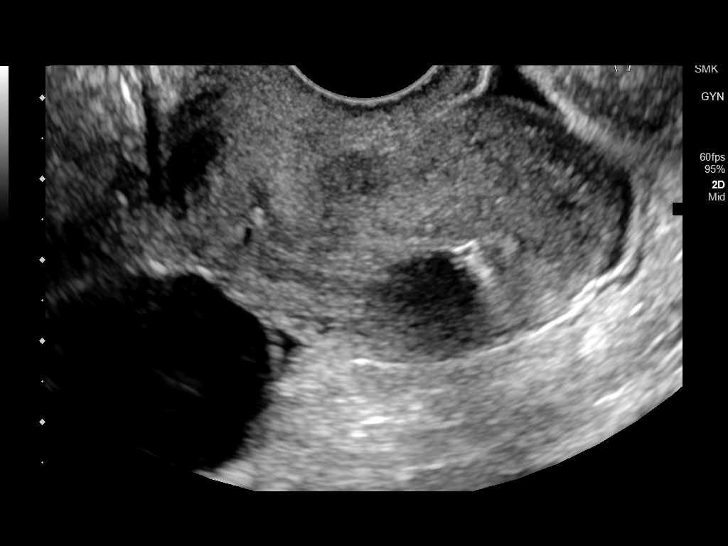
[im 65/155]
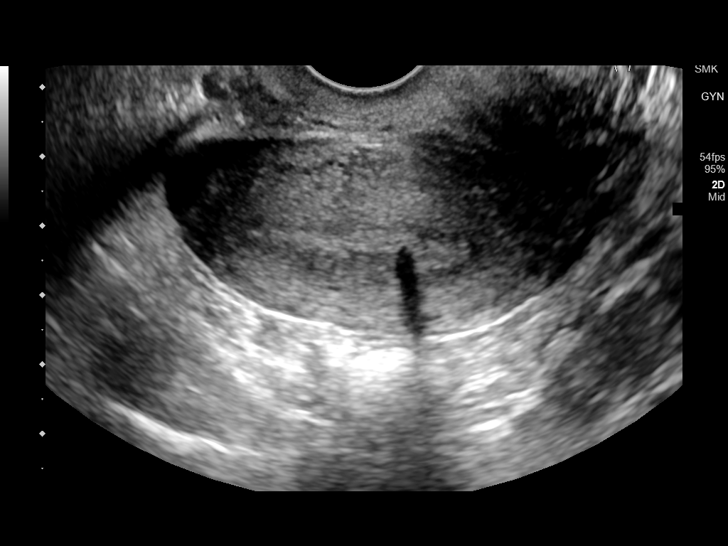
[im 78/155]
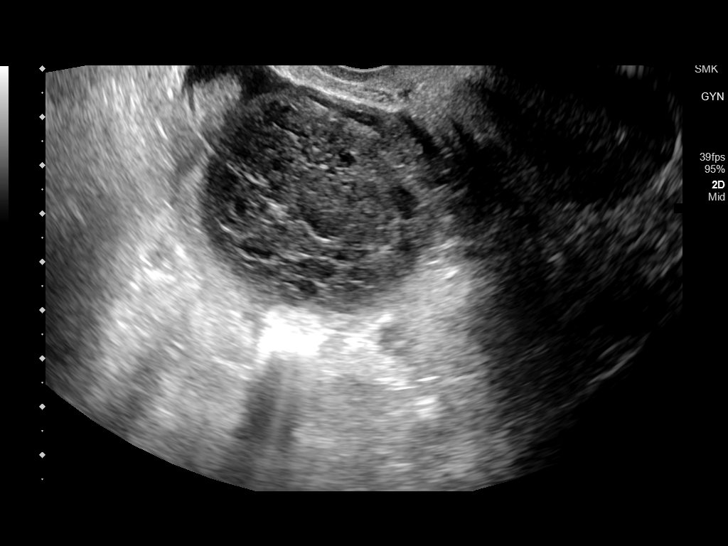
[im 90/155]
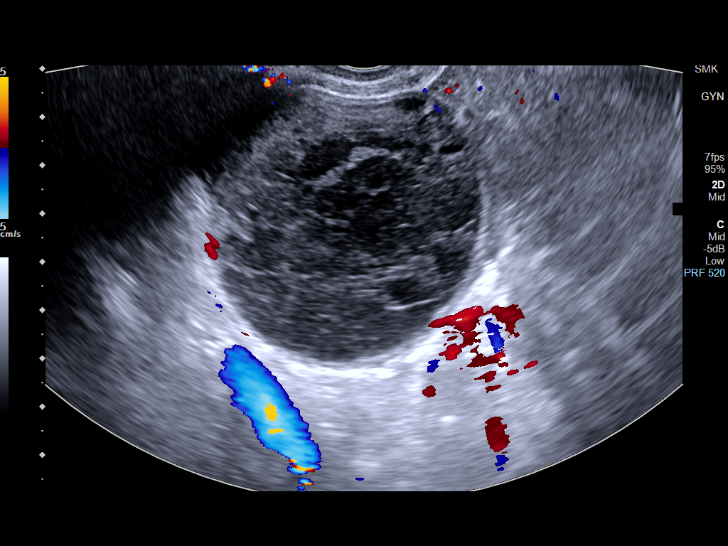
[im 103/155]
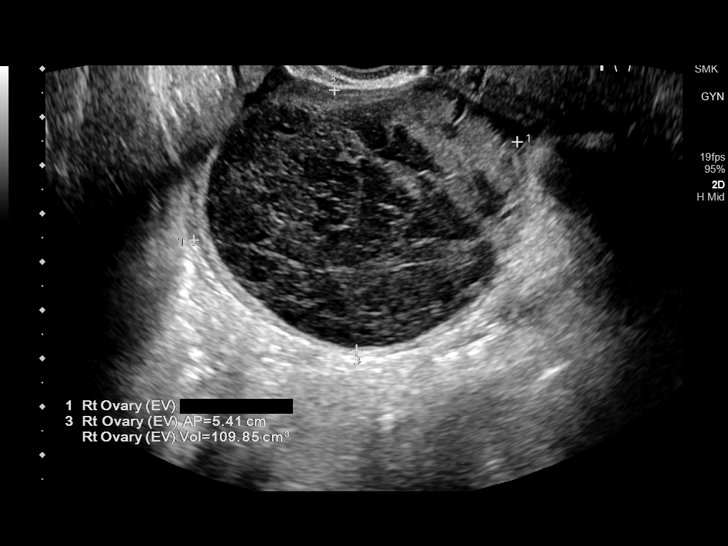
[im 116/155]
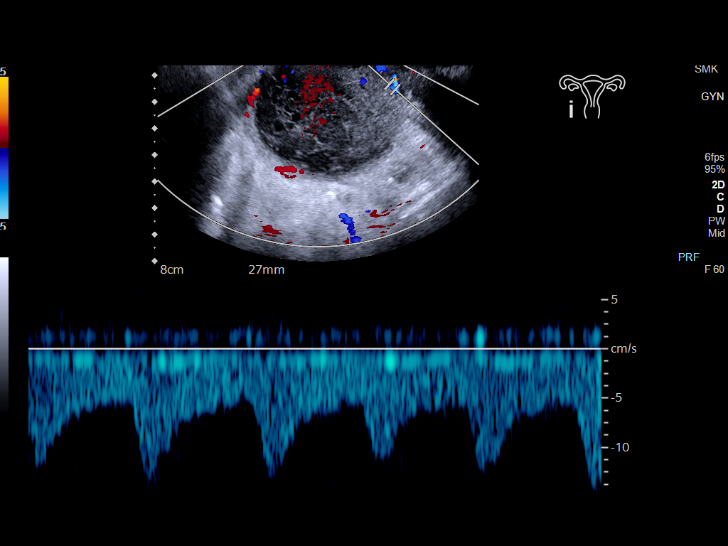
[im 129/155]
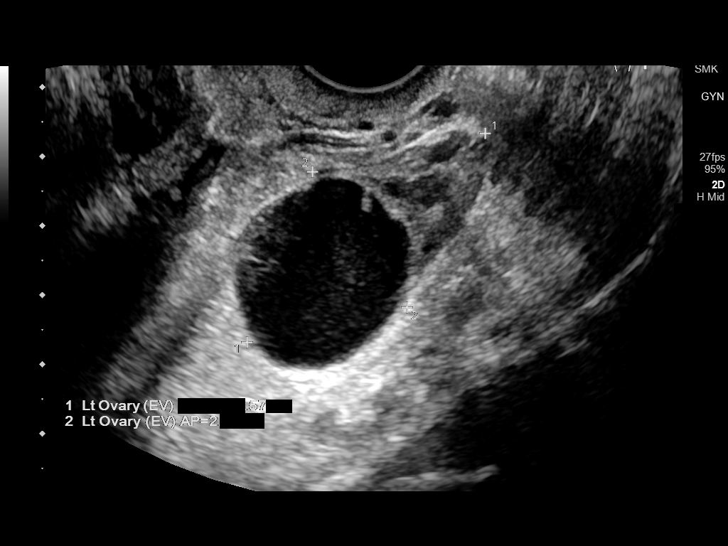
[im 142/155]
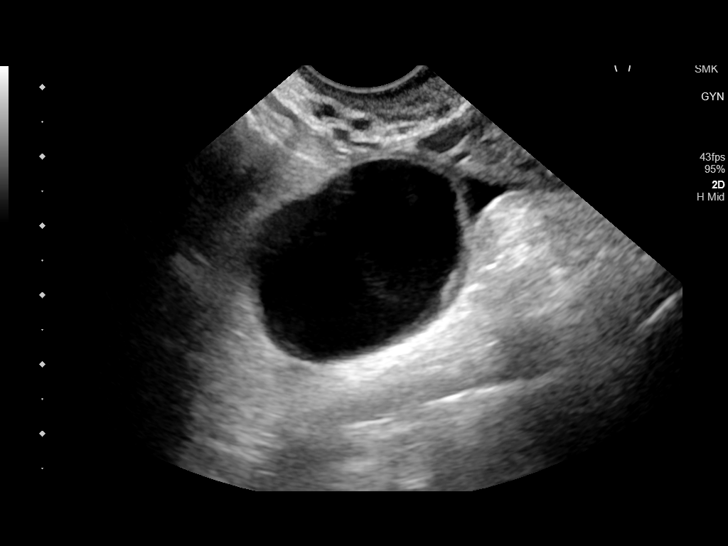
[im 155/155]
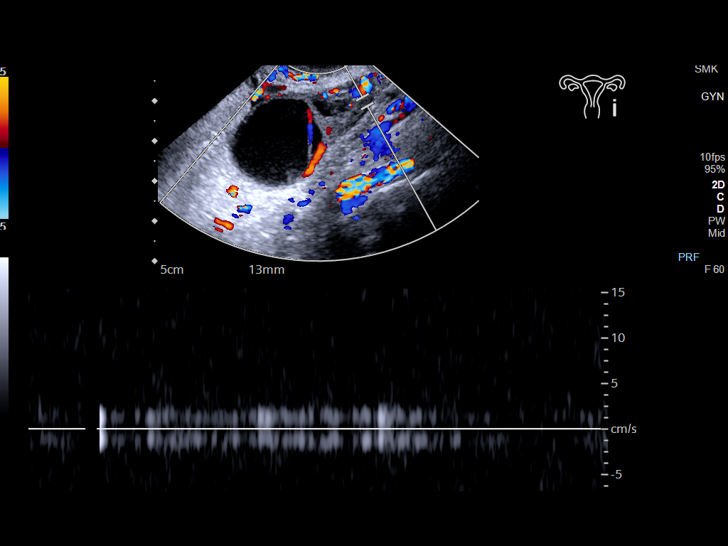

[13 of 25 positions shown; findings below may reference images not displayed]

FINDINGS: Uterus

Measurements: 6.0 x 3.1 x 4.9 cm = volume: 48 mL. No fibroids or
other mass visualized.

Endometrium

Thickness: 4 mm. No focal abnormality visualized. An IUD is noted
within the endometrial canal with the uterine fundus, in appropriate
position.

Right ovary

Measurements: 7.0 x 5.4 x 5.5 cm = volume: 110 mL. Within the right
ovary there is a 6.4 x 5.3 x 5.8 cm predominantly anechoic structure
with thin internal lace-like reticulations and mild peripheral wall
echogenicity. No definite internal color flow vascularity this is
compatible with a hemorrhagic cyst. Normal arterial and venous blood
flow.

Left ovary

Measurements: 4.6 x 2.4 x 3.1 cm = volume: 18 mL. There is a
well-circumscribed avascular cyst with mild internal echogenic
debris and a single thin internal septation, measuring 2.7 x 2.6 x
3.0 cm. Normal arterial and venous blood flow.

Pulsed Doppler evaluation of both ovaries demonstrates normal
low-resistance arterial and venous waveforms.

Other findings

Trace free fluid.
IMPRESSION: :
IMPRESSION: 1. There is a complex lesion within the right adnexa compatible with
a hemorrhagic cyst. Recommend follow-up ultrasound in 3 months. This
recommendation follows the consensus statement: Management of
Asymptomatic Ovarian and Other Adnexal Cysts Imaged at US: Society
of Radiologists in Ultrasound Consensus Conference Statement.
2. Mildly complex left adnexal cyst.  Attention on follow-up.

## 2023-06-22 ENCOUNTER — Emergency Department
Admission: EM | Admit: 2023-06-22 | Discharge: 2023-06-22 | Disposition: A | Discharge status: Home / Self Care | Payer: BC Managed Care – PPO | Attending: Emergency Medicine | Admitting: Emergency Medicine

## 2023-06-22 ENCOUNTER — Emergency Department: Payer: BC Managed Care – PPO

## 2023-06-22 ENCOUNTER — Other Ambulatory Visit: Payer: Self-pay

## 2023-06-22 DIAGNOSIS — Z7951 Long term (current) use of inhaled steroids: Secondary | ICD-10-CM | POA: Diagnosis not present

## 2023-06-22 DIAGNOSIS — J4521 Mild intermittent asthma with (acute) exacerbation: Secondary | ICD-10-CM

## 2023-06-22 DIAGNOSIS — J45901 Unspecified asthma with (acute) exacerbation: Secondary | ICD-10-CM | POA: Diagnosis not present

## 2023-06-22 DIAGNOSIS — Z20822 Contact with and (suspected) exposure to covid-19: Secondary | ICD-10-CM | POA: Insufficient documentation

## 2023-06-22 DIAGNOSIS — R0602 Shortness of breath: Secondary | ICD-10-CM | POA: Diagnosis present

## 2023-06-22 LAB — CBC WITH DIFFERENTIAL/PLATELET
Abs Immature Granulocytes: 0.02 10*3/uL (ref 0.00–0.07)
Basophils Absolute: 0.1 10*3/uL (ref 0.0–0.1)
Basophils Relative: 1 %
Eosinophils Absolute: 1.1 10*3/uL — ABNORMAL HIGH (ref 0.0–0.5)
Eosinophils Relative: 17 %
HCT: 42.3 % (ref 36.0–46.0)
Hemoglobin: 14.5 g/dL (ref 12.0–15.0)
Immature Granulocytes: 0 %
Lymphocytes Relative: 34 %
Lymphs Abs: 2.3 10*3/uL (ref 0.7–4.0)
MCH: 29.7 pg (ref 26.0–34.0)
MCHC: 34.3 g/dL (ref 30.0–36.0)
MCV: 86.7 fL (ref 80.0–100.0)
Monocytes Absolute: 0.4 10*3/uL (ref 0.1–1.0)
Monocytes Relative: 5 %
Neutro Abs: 2.9 10*3/uL (ref 1.7–7.7)
Neutrophils Relative %: 43 %
Platelets: 293 10*3/uL (ref 150–400)
RBC: 4.88 MIL/uL (ref 3.87–5.11)
RDW: 13.1 % (ref 11.5–15.5)
WBC: 6.8 10*3/uL (ref 4.0–10.5)
nRBC: 0 % (ref 0.0–0.2)

## 2023-06-22 LAB — HCG, QUANTITATIVE, PREGNANCY: hCG, Beta Chain, Quant, S: <1 m[IU]/mL (ref <5)

## 2023-06-22 LAB — BASIC METABOLIC PANEL
Anion gap: 12 (ref 5–15)
BUN: 24 mg/dL — ABNORMAL HIGH (ref 6–20)
CO2: 20 mmol/L — ABNORMAL LOW (ref 22–32)
Calcium: 9.1 mg/dL (ref 8.9–10.3)
Chloride: 103 mmol/L (ref 98–111)
Creatinine, Ser: 0.72 mg/dL (ref 0.44–1.00)
GFR, Estimated: >60 mL/min (ref >60)
Glucose, Bld: 129 mg/dL — ABNORMAL HIGH (ref 70–99)
Potassium: 3.3 mmol/L — ABNORMAL LOW (ref 3.5–5.1)
Sodium: 135 mmol/L (ref 135–145)

## 2023-06-22 LAB — RESP PANEL BY RT-PCR (RSV, FLU A&B, COVID)  RVPGX2
Influenza A by PCR: NEGATIVE
Influenza B by PCR: NEGATIVE
Resp Syncytial Virus by PCR: NEGATIVE
SARS Coronavirus 2 by RT PCR: NEGATIVE

## 2023-06-22 MED ORDER — SODIUM CHLORIDE 0.9 % IV BOLUS
1000.0000 mL | Freq: Once | INTRAVENOUS | Status: AC
  Administered 2023-06-22: 1000 mL via INTRAVENOUS

## 2023-06-22 MED ORDER — PREDNISONE 10 MG PO TABS: 50.0000 mg | ORAL_TABLET | Freq: Every day | ORAL | 0 refills | Status: DC

## 2023-06-22 MED ORDER — KETOROLAC TROMETHAMINE 30 MG/ML IJ SOLN
15.0000 mg | Freq: Once | INTRAMUSCULAR | Status: AC
Start: 2023-06-22 — End: 2023-06-22
  Administered 2023-06-22: 15 mg via INTRAVENOUS
  Filled 2023-06-22: qty 1

## 2023-06-22 MED ORDER — ALBUTEROL SULFATE HFA 108 (90 BASE) MCG/ACT IN AERS: 2.0000 | INHALATION_SPRAY | RESPIRATORY_TRACT | 2 refills | Status: AC | PRN

## 2023-06-22 MED ORDER — ALBUTEROL SULFATE HFA 108 (90 BASE) MCG/ACT IN AERS: 2.0000 | INHALATION_SPRAY | RESPIRATORY_TRACT | 2 refills | Status: DC | PRN

## 2023-06-22 MED ORDER — MAGNESIUM SULFATE 2 GM/50ML IV SOLN
2.0000 g | Freq: Once | INTRAVENOUS | Status: AC
  Administered 2023-06-22: 2 g via INTRAVENOUS
  Filled 2023-06-22: qty 50

## 2023-06-22 MED ORDER — AEROCHAMBER PLUS FLO-VU MEDIUM MISC: 1.0000 | Freq: Once | 0 refills | Status: AC

## 2023-06-22 MED ORDER — MOMETASONE FURO-FORMOTEROL FUM 200-5 MCG/ACT IN AERO: 2.0000 | INHALATION_SPRAY | Freq: Two times a day (BID) | RESPIRATORY_TRACT | 3 refills | Status: DC

## 2023-06-22 MED ORDER — MOMETASONE FURO-FORMOTEROL FUM 200-5 MCG/ACT IN AERO: 2.0000 | INHALATION_SPRAY | Freq: Two times a day (BID) | RESPIRATORY_TRACT | 3 refills | Status: AC

## 2023-06-22 MED ORDER — ALBUTEROL SULFATE (2.5 MG/3ML) 0.083% IN NEBU
5.0000 mg | INHALATION_SOLUTION | RESPIRATORY_TRACT | Status: AC
  Administered 2023-06-22 (×2): 5 mg via RESPIRATORY_TRACT
  Filled 2023-06-22 (×3): qty 6

## 2023-06-22 MED ORDER — IPRATROPIUM BROMIDE 0.02 % IN SOLN
0.5000 mg | RESPIRATORY_TRACT | Status: AC
  Administered 2023-06-22 (×2): 0.5 mg via RESPIRATORY_TRACT
  Filled 2023-06-22 (×3): qty 2.5

## 2023-06-22 MED ORDER — PREDNISONE 10 MG PO TABS: 50.0000 mg | ORAL_TABLET | Freq: Every day | ORAL | 0 refills | Status: AC

## 2023-06-22 MED ORDER — ALBUTEROL SULFATE (2.5 MG/3ML) 0.083% IN NEBU
2.5000 mg | INHALATION_SOLUTION | RESPIRATORY_TRACT | 2 refills | Status: DC | PRN
Start: 2023-06-22 — End: 2023-06-22

## 2023-06-22 MED ORDER — HYDROCODONE-ACETAMINOPHEN 5-325 MG PO TABS
1.0000 | ORAL_TABLET | Freq: Once | ORAL | Status: AC
  Administered 2023-06-22: 1 via ORAL
  Filled 2023-06-22: qty 1

## 2023-06-22 MED ORDER — ALBUTEROL SULFATE (2.5 MG/3ML) 0.083% IN NEBU: 2.5000 mg | INHALATION_SOLUTION | RESPIRATORY_TRACT | 2 refills | Status: DC | PRN

## 2023-06-22 MED ORDER — METHYLPREDNISOLONE SODIUM SUCC 125 MG IJ SOLR
125.0000 mg | Freq: Once | INTRAMUSCULAR | Status: AC
  Administered 2023-06-22: 125 mg via INTRAVENOUS
  Filled 2023-06-22: qty 2

## 2023-06-22 MED ORDER — AEROCHAMBER PLUS FLO-VU MEDIUM MISC: 1.0000 | Freq: Once | 0 refills | Status: DC

## 2023-06-22 NOTE — ED Provider Notes (Signed)
 Assumed care from Dr. Neomi at 7 AM. Briefly, the patient is a 23 y.o. female with PMHx of  has a past medical history of Asthma. here with cough, SOB, wheezing.   Labs Reviewed  CBC WITH DIFFERENTIAL/PLATELET - Abnormal; Notable for the following components:      Result Value   Eosinophils Absolute 1.1 (*)    All other components within normal limits  BASIC METABOLIC PANEL - Abnormal; Notable for the following components:   Potassium 3.3 (*)    CO2 20 (*)    Glucose, Bld 129 (*)    BUN 24 (*)    All other components within normal limits  RESP PANEL BY RT-PCR (RSV, FLU A&B, COVID)  RVPGX2  HCG, QUANTITATIVE, PREGNANCY    Course of Care: On my exam, pt has clear breath sounds, no wheezing, but is tachycardic, tremulous likely from albuterol . She does have some chest pressure which I suspect is from coughing/WOB. EKG nonischemic. Toradol , fluids, and reassess. -Patient feels markedly improved, ambulating without difficulty.  She is satting well on room air.  She would like to go home.  I think this is reasonable.  Will prescribe steroids, as well as give her refills of her controller medication.  Albuterol  nebulizer and inhaler given.  Return precautions given.  She has no fevers, no sputum production, no focal chest x-ray findings to suggest pneumonia.  Return precautions given.     Angelena Smalls, MD 06/22/23 [phone]

## 2023-06-22 NOTE — ED Triage Notes (Signed)
 Pt reports sob since yesterday, pt has hx asthma and symptoms have been unrelieved by at home treatments. Pt having audible inspiratory wheezes in triage and breathing sounds tight. Pt repots chest discomfort.

## 2023-06-22 NOTE — ED Notes (Signed)
 Oxygen saturation while ambulating 95%, heart rate 110.  Patient denies increased shortness of breath while ambulating.

## 2023-06-22 NOTE — ED Provider Notes (Signed)
 [facility] Provider Note    Event Date/Time   First MD Initiated Contact with Patient 06/22/23 [phone]     (approximate)   History   Asthma   HPI  Angela Mckenzie is a 23 y.o. female with history of asthma who presents to the emergency department with complaints of several days of shortness of breath, productive cough, wheezing.  Reports her chest is also tight.  No known fevers.  Has used her albuterol  nebulizer at home without relief.  Has been admitted before for asthma exacerbations.   History provided by patient.    Past Medical History:  Diagnosis Date   Asthma     Past Surgical History:  Procedure Laterality Date   BREAST LUMPECTOMY     WISDOM TOOTH EXTRACTION      MEDICATIONS:  Prior to Admission medications   Medication Sig Start Date End Date Taking? Authorizing Provider  albuterol  (PROVENTIL ) (2.5 MG/3ML) 0.083% nebulizer solution Take 3 mLs (2.5 mg total) by nebulization every 6 (six) hours as needed for wheezing or shortness of breath. 09/07/20   Arlander Charleston, MD  albuterol  (VENTOLIN  HFA) 108 (90 Base) MCG/ACT inhaler Inhale 2 puffs into the lungs every 6 (six) hours as needed.    [provider]  cetirizine (ZYRTEC) 10 MG tablet Take 10 mg by mouth daily.    [provider]  EPINEPHrine  0.3 mg/0.3 mL IJ SOAJ injection  01/31/18   [provider]  fluticasone  (FLONASE ) 50 MCG/ACT nasal spray Place 1 spray into both nostrils 2 (two) times daily. 07/18/21   [provider]  mometasone -formoterol  (DULERA ) 200-5 MCG/ACT AERO Inhale 2 puffs into the lungs 2 (two) times daily. 09/16/20   Drusilla Sabas RAMAN, MD  montelukast  (SINGULAIR ) 10 MG tablet Take 1 tablet (10 mg total) by mouth at bedtime. 09/16/20   Drusilla Sabas RAMAN, MD  norgestimate-ethinyl estradiol (ORTHO-CYCLEN) 0.25-35 MG-MCG tablet Take 1 tablet by mouth daily. 08/19/20   [provider]  ondansetron  (ZOFRAN  ODT) 4 MG disintegrating tablet Take 1  tablet (4 mg total) by mouth every 8 (eight) hours as needed for nausea or vomiting. 06/30/18   Emil Share, DO  predniSONE  (STERAPRED UNI-PAK 21 TAB) 10 MG (21) TBPK tablet Take as prescribed per dosage pack 10/26/21   Clide Burnard Ee, MD  valACYclovir (VALTREX) 500 MG tablet Take 500 mg by mouth daily. 12/05/17   [provider]    Physical Exam   Triage Vital Signs: ED Triage Vitals  Encounter Vitals Group     BP 06/22/23 0619 139/86     Systolic BP Percentile --      Diastolic BP Percentile --      Pulse Rate 06/22/23 0619 (!) 130     Resp 06/22/23 0619 (!) 25     Temp 06/22/23 0619 98.2 F (36.8 C)     Temp Source 06/22/23 0619 Oral     SpO2 06/22/23 0619 94 %     Weight 06/22/23 0618 120 lb (54.4 kg)     Height 06/22/23 0618 5' 6 (1.676 m)     Head Circumference --      Peak Flow --      Pain Score 06/22/23 0619 7     Pain Loc --      Pain Education --      Exclude from Growth Chart --     Most recent vital signs: Vitals:   06/22/23 0619 06/22/23 0650  BP: 139/86 127/75  Pulse: (!) 130 (!)  101  Resp: (!) 25 18  Temp: 98.2 F (36.8 C)   SpO2: 94% 100%    CONSTITUTIONAL: Alert, responds appropriately to questions.  Appears uncomfortable HEAD: Normocephalic, atraumatic EYES: Conjunctivae clear, pupils appear equal, sclera nonicteric ENT: normal nose; moist mucous membranes NECK: Supple, normal ROM CARD: Regular and tachycardic; S1 and S2 appreciated RESP: Diffuse inspiratory and expiratory wheezes.  Diminished aeration at bases bilaterally.  Increased work of breathing with tachypnea.  No hypoxia on room air at rest.  Speaking short sentences.  In respiratory distress. ABD/GI: Non-distended; soft, non-tender, no rebound, no guarding, no peritoneal signs BACK: The back appears normal EXT: Normal ROM in all joints; no deformity noted, no edema, no calf tenderness or calf swelling SKIN: Normal color for age and race; warm; no rash on exposed skin NEURO:  Moves all extremities equally, normal speech PSYCH: The patient's mood and manner are appropriate.   ED Results / Procedures / Treatments   LABS: (all labs ordered are listed, but only abnormal results are displayed) Labs Reviewed  CBC WITH DIFFERENTIAL/PLATELET - Abnormal; Notable for the following components:      Result Value   Eosinophils Absolute 1.1 (*)    All other components within normal limits  BASIC METABOLIC PANEL - Abnormal; Notable for the following components:   Potassium 3.3 (*)    CO2 20 (*)    Glucose, Bld 129 (*)    BUN 24 (*)    All other components within normal limits  RESP PANEL BY RT-PCR (RSV, FLU A&B, COVID)  RVPGX2  HCG, QUANTITATIVE, PREGNANCY     EKG:  EKG Interpretation Date/Time:  Thursday June 22 2023 06:29:34 EST Ventricular Rate:  90 PR Interval:  118 QRS Duration:  86 QT Interval:  355 QTC Calculation: 435 R Axis:   87  Text Interpretation: Sinus rhythm Borderline short PR interval Confirmed by Neomi Neptune [phone]) on 06/22/2023 6:36:45 AM         RADIOLOGY: My personal review and interpretation of imaging: Chest x-ray clear.  I have personally reviewed all radiology reports.   DG Chest Portable 1 View Result Date: 06/22/2023 CLINICAL DATA:  23 year old female with history of shortness of breath. EXAM: PORTABLE CHEST 1 VIEW COMPARISON:  Chest x-ray 10/26/2021. FINDINGS: Lung volumes are normal. No consolidative airspace disease. No pleural effusions. No pneumothorax. No pulmonary nodule or mass noted. Pulmonary vasculature and the cardiomediastinal silhouette are within normal limits. IMPRESSION: No radiographic evidence of acute cardiopulmonary disease. Electronically Signed   By: Toribio Aye M.D.   On: 06/22/2023 06:57     PROCEDURES:  Critical Care performed: Yes, see critical care procedure note(s)   CRITICAL CARE Performed by: Neptune Glennda Weatherholtz   Total critical care time: 35 minutes  Critical care time was exclusive  of separately billable procedures and treating other patients.  Critical care was necessary to treat or prevent imminent or life-threatening deterioration.  Critical care was time spent personally by me on the following activities: development of treatment plan with patient and/or surrogate as well as nursing, discussions with consultants, evaluation of patient's response to treatment, examination of patient, obtaining history from patient or surrogate, ordering and performing treatments and interventions, ordering and review of laboratory studies, ordering and review of radiographic studies, pulse oximetry and re-evaluation of patient's condition.   SABRA1-3 Lead EKG Interpretation  Performed by: Lashanna Angelo, Neptune SAILOR, DO Authorized by: Tanis Hensarling, Neptune SAILOR, DO     Interpretation: abnormal     ECG rate:  130  ECG rate assessment: tachycardic     Rhythm: sinus tachycardia     Ectopy: none     Conduction: normal       IMPRESSION / MDM / ASSESSMENT AND PLAN / ED COURSE  I reviewed the triage vital signs and the nursing notes.    Patient here with asthma exacerbation.  The patient is on the cardiac monitor to evaluate for evidence of arrhythmia and/or significant heart rate changes.   DIFFERENTIAL DIAGNOSIS (includes but not limited to):   Asthma exacerbation, pneumonia, pneumothorax, doubt ACS, PE, dissection   Patient's presentation is most consistent with acute presentation with potential threat to life or bodily function.   PLAN: Will give breathing treatments, IV magnesium  and Solu-Medrol .  No hypoxia but does have increased work of breathing and respiratory distress.  Will obtain labs, chest x-ray, COVID and flu swab.   MEDICATIONS GIVEN IN ED: Medications  albuterol  (PROVENTIL ) (2.5 MG/3ML) 0.083% nebulizer solution 5 mg (5 mg Nebulization Given 06/22/23 0707)  ipratropium (ATROVENT ) nebulizer solution 0.5 mg (0.5 mg Nebulization Given 06/22/23 0707)  magnesium  sulfate IVPB 2 g 50 mL (2  g Intravenous New Bag/Given 06/22/23 0646)  methylPREDNISolone  sodium succinate (SOLU-MEDROL ) 125 mg/2 mL injection 125 mg (125 mg Intravenous Given 06/22/23 0644)     ED COURSE: Patient's labs show no leukocytosis.  Normal electrolytes.  Normal creatinine.  Chest x-ray reviewed and interpreted by myself and the radiologist and is clear.  COVID, flu and RSV testing pending.  Signed out to oncoming ED physician to reassess after nebulizer treatments.   CONSULTS: Pending further workup.   OUTSIDE RECORDS REVIEWED: Reviewed last PCP note on 02/03/2023.       FINAL CLINICAL IMPRESSION(S) / ED DIAGNOSES   Final diagnoses:  Exacerbation of intermittent asthma, unspecified asthma severity     Rx / DC Orders   ED Discharge Orders     None        Note:  This document was prepared using Dragon voice recognition software and may include unintentional dictation errors.   Annet Manukyan, Josette SAILOR, DO 06/22/23 [phone]

## 2023-08-09 NOTE — Progress Notes (Signed)
 History of Present Illness:   Angela Mckenzie is a 23 y.o. female here for   Verbally consented to the use of AI for note-taking.   Chief Complaint  Patient presents with  . ADHD    History of Present Illness Angela Mckenzie is a 23 year old female with anxiety and ADHD who presents with exacerbation of anxiety symptoms and academic stress.  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 A recent failure in a dosage calculation exam has delayed her graduation by six months, causing considerable distress and feelings of isolation from her peers. She does not wish to restart SSRI treatment.  She is considering alternatives such as propranolol or buspirone. Her history of anxiety and ADHD contributes to her current academic challenges, and she acknowledges setting high standards for herself, leading to disappointment when these are not met. Despite exploring coping mechanisms like distraction, she continues to struggle with the emotional impact of her academic setbacks.  She has experienced significant weight loss over the past six months, dropping from 137 pounds to 111 pounds. She attributes this to stress and a lack of appetite, despite having an appetite but no interest in eating. Her BMI is now 18, which is below normal. She notes not eating well and feeling nauseous, with Zofran  no longer providing relief.  She reports a recent Emergency Room visit for asthma exacerbation, attributed to cold weather and physical exertion. She was treated with Solu-Medrol  and magnesium . She uses montelukast , Zyrtec, and Dulera  with a spacer, and has a new nebulizer for PRN use. She mentions difficulty with rinsing her mouth after using her inhaler, leading to concerns about oral thrush.   Past Medical History:   Past Medical History:  Diagnosis Date  . Allergy   . Asthma, unspecified asthma severity, unspecified whether complicated, unspecified whether persistent  (HHS-HCC)     Past Surgical History:   Past Surgical History:  Procedure Laterality Date  . Lump ectomy Left    Breast beign  . wisdom teeth removal      Allergies:   Allergies  Allergen Reactions  . Peanut Anaphylaxis  . 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 Mirtazapine Other (See Comments)    Urinary retention  . Allergenic Extracts Unknown    Current Medications:   Prior to Admission medications   Medication Sig Taking? Last Dose  albuterol  (PROVENTIL ) 2.5 mg /3 mL (0.083 %) nebulizer solution albuterol  sulfate 2.5 mg/3 mL (0.083 %) solution for nebulization  USE 1 VIAL VIA NEBULIZER EVERY 4-6 HOURS AS NEEDED FOR COUGHING OR WHEEZING Yes Taking  albuterol  MDI, PROVENTIL , VENTOLIN , PROAIR , HFA 90 mcg/actuation inhaler Inhale 2 inhalations into the lungs every 6 (six) hours as needed for Wheezing Yes Taking  EPINEPHrine  (EPIPEN ) 0.3 mg/0.3 mL auto-injector Inject 0.3 mg into the muscle once as needed Yes Taking  lisdexamfetamine (VYVANSE) 40 MG capsule Take 1 capsule (40 mg total) by mouth every morning Yes Taking  ondansetron  (ZOFRAN -ODT) 4 MG disintegrating tablet Take 1 tablet (4 mg total) by mouth every 8 (eight) hours as needed for Nausea Yes Taking  budesonide (PULMICORT) 1 mg/2 mL nebulizer solution Take 2 mLs (1 mg total) by nebulization once daily    mometasone -formoterol  (DULERA ) 200-5 mcg/actuation inhaler Inhale 2 inhalations into the lungs 2 (two) times daily    norethindrone-ethinyl estradiol (JUNEL 1.5/30) 1.5-30 mg-mcg tablet Take 1 tablet by mouth once daily Patient not taking: Reported on 08/09/2023  Not  Taking  propranoloL (INDERAL) 10 MG tablet Take 1 tablet (10 mg total) by mouth 2 (two) times daily      Family History:   Family History  Problem Relation Name Age of Onset  . High blood pressure (Hypertension) Mother    . Hyperlipidemia (Elevated cholesterol) Mother    . Diabetes Mother    . Bladder Cancer Father    . Kidney cancer  Father    . High blood pressure (Hypertension) Father    . Hyperlipidemia (Elevated cholesterol) Father      Social History:   Social History   Socioeconomic History  . Marital status: Single  Tobacco Use  . Smoking status: Never  . Smokeless tobacco: Never  . Tobacco comments:    Vapes  Vaping Use  . Vaping status: Every Day  Substance and Sexual Activity  . Alcohol use: Not Currently  . Drug use: Never  . Sexual activity: Yes    Partners: Male    Birth control/protection: I.U.D.   Social Drivers of Corporate Investment Banker Strain: Low Risk  (08/09/2023)   Overall Financial Resource Strain (CARDIA)   . Difficulty of Paying Living Expenses: Not hard at all  Food Insecurity: No Food Insecurity (08/09/2023)   Hunger Vital Sign   . Worried About Programme Researcher, Broadcasting/film/video in the Last Year: Never true   . Ran Out of Food in the Last Year: Never true  Transportation Needs: No Transportation Needs (08/09/2023)   PRAPARE - Transportation   . Lack of Transportation (Medical): No   . Lack of Transportation (Non-Medical): No   Received from [facility]   Social Network  Housing Stability: Unknown (08/09/2023)   Housing Stability Vital Sign   . Unable to Pay for Housing in the Last Year: No   . Homeless in the Last Year: No    Review of Systems:   A 10 point review of systems is negative, except for the pertinent positives and negatives detailed in the HPI.  Vitals:   Vitals:   08/09/23 1041  BP: 128/74  Pulse: 108  SpO2: 99%  Weight: 50.6 kg (111 lb 9.6 oz)  Height: 167.6 cm (5' 6)     Body mass index is 18.01 kg/m.  Physical Exam:   Physical Exam Vitals and nursing note reviewed.  Constitutional:      General: She is not in acute distress.    Appearance: Normal appearance. She is not ill-appearing, toxic-appearing or diaphoretic.  HENT:     Head: Normocephalic and atraumatic.     Right Ear: External ear normal.     Left Ear: External ear  normal.  Eyes:     General:        Right eye: No discharge.        Left eye: No discharge.     Conjunctiva/sclera: Conjunctivae normal.  Cardiovascular:     Rate and Rhythm: Normal rate and regular rhythm.     Pulses: Normal pulses.     Heart sounds: Normal heart sounds. No murmur heard.    No friction rub. No gallop.  Pulmonary:     Effort: Pulmonary effort is normal. No respiratory distress.     Breath sounds: Normal breath sounds. No stridor. No wheezing, rhonchi or rales.  Chest:     Chest wall: No tenderness.  Skin:    General: Skin is warm and dry.     Capillary Refill: Capillary refill takes less than 2 seconds.  Neurological:  Mental Status: She is alert.  Psychiatric:        Mood and Affect: Mood is anxious and depressed. Affect is tearful.     Assessment and Plan:   Results for orders placed or performed in visit on 08/09/23  CBC w/auto Differential (5 Part)  Result Value Ref Range   WBC (White Blood Cell Count) 10.2 4.1 - 10.2 10^3/uL   RBC (Red Blood Cell Count) 4.69 4.04 - 5.48 10^6/uL   Hemoglobin 14.1 12.0 - 15.0 gm/dL   Hematocrit 57.1 64.9 - 47.0 %   MCV (Mean Corpuscular Volume) 91.3 80.0 - 100.0 fl   MCH (Mean Corpuscular Hemoglobin) 30.1 27.0 - 31.2 pg   MCHC (Mean Corpuscular Hemoglobin Concentration) 32.9 32.0 - 36.0 gm/dL   Platelet Count 707 849 - 450 10^3/uL   RDW-CV (Red Cell Distribution Width) 12.6 11.6 - 14.8 %   MPV (Mean Platelet Volume) 10.3 9.4 - 12.4 fl   Neutrophils 7.28 1.50 - 7.80 10^3/uL   Lymphocytes 1.90 1.00 - 3.60 10^3/uL   Monocytes 0.61 0.00 - 1.50 10^3/uL   Eosinophils 0.29 0.00 - 0.55 10^3/uL   Basophils 0.07 0.00 - 0.09 10^3/uL   Neutrophil % 71.5 (H) 32.0 - 70.0 %   Lymphocyte % 18.6 10.0 - 50.0 %   Monocyte % 6.0 4.0 - 13.0 %   Eosinophil % 2.8 1.0 - 5.0 %   Basophil% 0.7 0.0 - 2.0 %   Immature Granulocyte % 0.4 <=0.7 %   Immature Granulocyte Count 0.04 <=0.06 10^3/L  Comprehensive Metabolic Panel (CMP)  Result  Value Ref Range   Glucose 75 70 - 110 mg/dL   Sodium 859 863 - 854 mmol/L   Potassium 4.3 3.6 - 5.1 mmol/L   Chloride 104 97 - 109 mmol/L   Carbon Dioxide (CO2) 22.2 22.0 - 32.0 mmol/L   Urea Nitrogen (BUN) 18 7 - 25 mg/dL   Creatinine 0.8 0.6 - 1.1 mg/dL   Glomerular Filtration Rate (eGFR) 105 >60 mL/min/1.73sq m   Calcium 10.2 8.7 - 10.3 mg/dL   AST  11 8 - 39 U/L   ALT  9 5 - 38 U/L   Alk Phos (alkaline Phosphatase) 50 34 - 104 U/L   Albumin 5.1 (H) 3.5 - 4.8 g/dL   Bilirubin, Total 1.2 0.3 - 1.2 mg/dL   Protein, Total 7.8 6.1 - 7.9 g/dL   A/G Ratio 1.9 1.0 - 5.0 gm/dL  Thyroid Stimulating-Hormone (TSH)  Result Value Ref Range   Thyroid Stimulating Hormone (TSH) 4.602 0.450-5.330 uIU/ml uIU/mL  Thyroxine (T4), Free  Result Value Ref Range   Thyroxine, Free (FT4) 1.16 (H) 0.66 - 1.14 ng/dL   Narrative   This reference range applies to non-pregnant individuals 18 or older. Contact Laboratory for reference ranges for pregnant patients.  Biotin ingestion may interfere with free T4 and Total T3 tests. If the results are inconsistent with the TSH level, previous test results, or the clinical presentation, then consider biotin interference. If needed, order repeat testing after stopping biotin.  Triiodothyronine (T3), Total  Result Value Ref Range   Triiodothyronine (T3), Total 140 87 - 178 ng/dL   Narrative   Biotin ingestion may interfere with free T4 and Total T3 tests. If the results are inconsistent with the TSH level, previous test results, or the clinical presentation, then consider biotin interference. If needed, order repeat testing after stopping biotin.  Cortisol  Result Value Ref Range   Cortisol 14.2 6.7 - 22.6 ug/dL  Magnesium   Result  Value Ref Range   Magnesium  1.8 1.8 - 2.5 mg/dL  Vitamin D, 25-Hydroxy - [facility]  Result Value Ref Range   Vitamin D, 25-Hydroxy - [facility] 17.7 (L) 30.0 - 100.0 ng/mL   Narrative   Performed at:  [address] - [facility] [address], [place], [place]  [id number] Lab Director: Frankey Sas MD, Phone:  [phone]  Vitamin B12  Result Value Ref Range   Vitamin B12 423 >300 pg/mL   Narrative   <200 pg/mL:    Low, consistent with Vitamin B12 Deficiency 200-300 pg/mL: Borderline, possible Vitamin B12 Deficiency >300 pg/mL:    Normal.  Vitamin B12 Deficiency is unlikely     Diagnoses and all orders for this visit:  GAD (generalized anxiety disorder) -     CBC w/auto Differential (5 Part) -     Comprehensive Metabolic Panel (CMP) -     Thyroid Stimulating-Hormone (TSH) -     Thyroxine (T4), Free -     Triiodothyronine (T3), Total -     Cortisol -     Magnesium   Environmental allergies -     CBC w/auto Differential (5 Part) -     Comprehensive Metabolic Panel (CMP) -     Thyroid Stimulating-Hormone (TSH) -     Thyroxine (T4), Free -     Triiodothyronine (T3), Total -     Cortisol -     Magnesium   Severe persistent asthma without complication (CMS/HHS-HCC) -     CBC w/auto Differential (5 Part) -     Comprehensive Metabolic Panel (CMP) -     Thyroid Stimulating-Hormone (TSH) -     Thyroxine (T4), Free -     Triiodothyronine (T3), Total -     Cortisol -     Magnesium   Hypercholesterolemia -     CBC w/auto Differential (5 Part) -     Comprehensive Metabolic Panel (CMP) -     Thyroid Stimulating-Hormone (TSH) -     Thyroxine (T4), Free -     Triiodothyronine (T3), Total -     Cortisol -     Magnesium   Attention deficit hyperactivity disorder (ADHD), unspecified ADHD type -     CBC w/auto Differential (5 Part) -     Comprehensive Metabolic Panel (CMP) -     Thyroid Stimulating-Hormone (TSH) -     Thyroxine (T4), Free -     Triiodothyronine (T3), Total -     Cortisol -     Magnesium   Vitamin B12 deficiency -     Vitamin B12  Other fatigue -     Vitamin D, 25-Hydroxy - [facility]  Other orders -     Follow up in Primary Care -     propranoloL (INDERAL) 10 MG tablet; Take 1 tablet (10 mg  total) by mouth 2 (two) times daily    Assessment & Plan Anxiety and Depression Significant anxiety and depressive symptoms exacerbated by academic and personal stressors. Reports severe anxiety, insomnia, and 26-pound weight loss over six months. Prefers as-needed medications like propranolol or buspirone over daily SSRIs. Discussed propranolol's risks, including potential fatigue and exacerbation of Raynaud's symptoms, and buspirone as an alternative. - Prescribe propranolol 10 mg, to be taken in the morning and as needed in the evening. - Refer to psych for further psychiatric management and therapy. - Order CBC, thyroid panel, and hormone levels including cortisol. - Follow up in one month to assess the effectiveness of propranolol and overall mental health.  Weight Loss Significant weight  loss from 137 lbs to 111 lbs over six months, likely due to stress and decreased appetite. BMI is 18, below normal. Emphasized the importance of regular meals and nutritional support. - Monitor weight and nutritional intake. - Check vitamin levels, including vitamin D and B12. - Encourage regular meals and nutritional support.  Asthma Recent exacerbation requiring hospitalization. Frequent inhaler use, prescribed montelukast  and Zyrtec. Uses nebulizer PRN and Dulera  with a spacer but struggles with mouth rinsing, leading to possible oral thrush. Discussed potential benefits of allergy shots if symptoms persist and insurance covers it. - Continue montelukast  and Zyrtec. - Use nebulizer treatments PRN. - Continue Dulera  with a spacer and ensure proper mouth rinsing after use. - Consider allergy shots if symptoms persist and insurance covers it.  Follow-up - Schedule a follow-up appointment in one month. - Check labs today for CBC, thyroid panel, and hormone levels. - Monitor the effectiveness of propranolol and adjust treatment as necessary.   There are no Patient Instructions on file for this  visit.   This note has been created using automated tools and reviewed for accuracy by provider.  Patient received an After Visit Summary    Attestation Statement:   I personally performed the service, non-incident to. (WP)   GLENDA MACARIO HADDOCK, NP

## 2023-12-29 NOTE — Progress Notes (Signed)
 History of Present Illness:   Angela Mckenzie is a 23 y.o. female here for   Verbally consented to the use of AI for note-taking.   Chief Complaint  Patient presents with  . Follow-up    Paperwork for school     History of Present Illness Angela Mckenzie is a 23 year old female who presents for medication management and form completion.  She experiences asthma exacerbations due to summer humidity, describing it as 'breathing thick air.' Phlegm and allergies are more pronounced in the morning. She uses Dulera  as a maintenance inhaler, which she finds helpful but admits to inconsistent use. Albuterol  is used as a rescue inhaler, with a recent emergency refill. She has not used her nebulizer recently.  She has ARFID and was previously seen at the [facility] for this. She was referred for nutritional advice, which she found unhelpful, and has since stopped seeing that provider.  She has ongoing issues with eczema, which improves in the summer and worsens in the winter.  She vapes and is interested in trying nicotine  patches to quit.  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   Past Medical History:   Past Medical History:  Diagnosis Date  . Acute severe asthma (HHS-HCC) 09/15/2020  . Allergy   . Asthma, unspecified asthma severity, unspecified whether complicated, unspecified whether persistent (HHS-HCC)     Past Surgical History:   Past Surgical History:  Procedure Laterality Date  . Lump ectomy Left    Breast beign  . wisdom teeth removal      Allergies:   Allergies  Allergen Reactions  . Peanut Anaphylaxis  . 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 Mirtazapine Other (See Comments)    Urinary retention  . Allergenic Extracts Unknown    Current Medications:   Prior to Admission medications  Medication Sig Taking? Last Dose  albuterol   (PROVENTIL ) 2.5 mg /3 mL (0.083 %) nebulizer solution albuterol  sulfate 2.5 mg/3 mL (0.083 %) solution for nebulization  USE 1 VIAL VIA NEBULIZER EVERY 4-6 HOURS AS NEEDED FOR COUGHING OR WHEEZING Yes Taking  albuterol  MDI, PROVENTIL , VENTOLIN , PROAIR , HFA 90 mcg/actuation inhaler Inhale 2 inhalations into the lungs every 6 (six) hours as needed for Wheezing Yes   azelastine (OPTIVAR) 0.05 % ophthalmic solution  Yes Taking  EPINEPHrine  (EPIPEN ) 0.3 mg/0.3 mL auto-injector Inject 0.3 mLs (0.3 mg total) into the muscle once as needed for up to 1 dose Yes   lisdexamfetamine (VYVANSE) 40 MG capsule Take 1 capsule (40 mg total) by mouth every morning Yes Taking  montelukast  (SINGULAIR ) 10 mg tablet  Yes Taking  norethindrone-ethinyl estradiol (JUNEL 1.5/30) 1.5-30 mg-mcg tablet Take 1 tablet by mouth once daily Yes Taking  ondansetron  (ZOFRAN -ODT) 4 MG disintegrating tablet Take 1 tablet (4 mg total) by mouth every 8 (eight) hours as needed for Nausea Yes   valACYclovir (VALTREX) 1000 MG tablet TAKE ONE TABLET BY MOUTH ONE TIME DAILY FOR 5 DAYS FOR SUPPRESSIVE THERAPY Yes Taking  budesonide (PULMICORT) 1 mg/2 mL nebulizer solution Take 2 mLs (1 mg total) by nebulization once daily    mometasone -formoterol  (DULERA ) 200-5 mcg/actuation inhaler Inhale 2 inhalations into the lungs 2 (two) times daily    nicotine  (NICODERM CQ ) 14 mg/24 hr patch Place 1 patch onto the skin daily for 14 days    propranoloL (INDERAL) 10 MG tablet Take 1 tablet (10  mg total) by mouth 2 (two) times daily      Family History:   Family History  Problem Relation Name Age of Onset  . High blood pressure (Hypertension) Mother    . Hyperlipidemia (Elevated cholesterol) Mother    . Diabetes Mother    . Bladder Cancer Father    . Kidney cancer Father    . High blood pressure (Hypertension) Father    . Hyperlipidemia (Elevated cholesterol) Father      Social History:   Social History   Socioeconomic History  . Marital  status: Single  Tobacco Use  . Smoking status: Never  . Smokeless tobacco: Never  . Tobacco comments:    Vapes  Vaping Use  . Vaping status: Every Day  Substance and Sexual Activity  . Alcohol use: Not Currently    Comment: rarely  . Drug use: Never  . Sexual activity: Yes    Partners: Male    Birth control/protection: I.U.D.   Social Drivers of Corporate Investment Banker Strain: Low Risk  (12/29/2023)   Overall Financial Resource Strain (CARDIA)   . Difficulty of Paying Living Expenses: Not hard at all  Food Insecurity: No Food Insecurity (12/29/2023)   Hunger Vital Sign   . Worried About Programme Researcher, Broadcasting/film/video in the Last Year: Never true   . Ran Out of Food in the Last Year: Never true  Transportation Needs: No Transportation Needs (12/29/2023)   PRAPARE - Transportation   . Lack of Transportation (Medical): No   . Lack of Transportation (Non-Medical): No   [facility]   Social Network  Housing Stability: Low Risk  (12/29/2023)   Housing Stability Vital Sign   . Unable to Pay for Housing in the Last Year: No   . Number of Times Moved in the Last Year: 0   . Homeless in the Last Year: No    Review of Systems:   A 10 point review of systems is negative, except for the pertinent positives and negatives detailed in the HPI.  Vitals:   Vitals:   12/29/23 0917  BP: 120/70  Pulse: 102  SpO2: 97%  Weight: 51.7 kg (114 lb)  Height: 167.6 cm (5' 5.98)     Body mass index is 18.41 kg/m.  Physical Exam:   Physical Exam Vitals and nursing note reviewed.  Constitutional:      General: She is not in acute distress.    Appearance: Normal appearance. She is not ill-appearing, toxic-appearing or diaphoretic.  HENT:     Head: Normocephalic and atraumatic.     Right Ear: External ear normal.     Left Ear: External ear normal.   Eyes:     General:        Right eye: No discharge.        Left eye: No discharge.     Conjunctiva/sclera: Conjunctivae normal.     Cardiovascular:     Rate and Rhythm: Normal rate and regular rhythm.     Pulses: Normal pulses.     Heart sounds: Normal heart sounds. No murmur heard.    No friction rub. No gallop.  Pulmonary:     Effort: Pulmonary effort is normal. No respiratory distress.     Breath sounds: Normal breath sounds. No stridor. No wheezing, rhonchi or rales.  Chest:     Chest wall: No tenderness.   Skin:    General: Skin is warm and dry.     Capillary Refill: Capillary refill  takes less than 2 seconds.   Neurological:     Mental Status: She is alert.     Assessment and Plan:   Results for orders placed or performed in visit on 12/29/23  CBC w/auto Differential (5 Part)  Result Value Ref Range   WBC (White Blood Cell Count) 7.2 4.1 - 10.2 10^3/uL   RBC (Red Blood Cell Count) 4.17 4.04 - 5.48 10^6/uL   Hemoglobin 12.2 12.0 - 15.0 gm/dL   Hematocrit 62.5 64.9 - 47.0 %   MCV (Mean Corpuscular Volume) 89.7 80.0 - 100.0 fl   MCH (Mean Corpuscular Hemoglobin) 29.3 27.0 - 31.2 pg   MCHC (Mean Corpuscular Hemoglobin Concentration) 32.6 32.0 - 36.0 gm/dL   Platelet Count 722 849 - 450 10^3/uL   RDW-CV (Red Cell Distribution Width) 13.1 11.6 - 14.8 %   MPV (Mean Platelet Volume) 9.9 9.4 - 12.4 fl   Neutrophils 4.06 1.50 - 7.80 10^3/uL   Lymphocytes 2.09 1.00 - 3.60 10^3/uL   Monocytes 0.34 0.00 - 1.50 10^3/uL   Eosinophils 0.65 (H) 0.00 - 0.55 10^3/uL   Basophils 0.05 0.00 - 0.09 10^3/uL   Neutrophil % 56.3 32.0 - 70.0 %   Lymphocyte % 28.9 10.0 - 50.0 %   Monocyte % 4.7 4.0 - 13.0 %   Eosinophil % 9.0 (H) 1.0 - 5.0 %   Basophil% 0.7 0.0 - 2.0 %   Immature Granulocyte % 0.4 <=0.7 %   Immature Granulocyte Count 0.03 <=0.06 10^3/L    Diagnoses and all orders for this visit:  Attention deficit hyperactivity disorder (ADHD), predominantly inattentive type -     CBC w/auto Differential (5 Part) -     Comprehensive Metabolic Panel (CMP) -     Vitamin B12 -     Vitamin D, 25-Hydroxy -  [facility] -     Magnesium  -     Thyroid Stimulating-Hormone (TSH)  Encounter for completion of form with patient  Vitamin D deficiency -     CBC w/auto Differential (5 Part) -     Comprehensive Metabolic Panel (CMP) -     Vitamin B12 -     Vitamin D, 25-Hydroxy - [facility] -     Magnesium  -     Thyroid Stimulating-Hormone (TSH)  Vitamin B12 deficiency -     CBC w/auto Differential (5 Part) -     Comprehensive Metabolic Panel (CMP) -     Vitamin B12 -     Vitamin D, 25-Hydroxy - [facility] -     Magnesium  -     Thyroid Stimulating-Hormone (TSH)  Mild persistent asthma without complication (HHS-HCC) -     CBC w/auto Differential (5 Part) -     Comprehensive Metabolic Panel (CMP) -     Vitamin B12 -     Vitamin D, 25-Hydroxy - [facility] -     Magnesium  -     Thyroid Stimulating-Hormone (TSH)  Environmental allergies  Generalized anxiety disorder  Avoidant-restrictive food intake disorder (ARFID) -     CBC w/auto Differential (5 Part) -     Comprehensive Metabolic Panel (CMP) -     Vitamin B12 -     Vitamin D, 25-Hydroxy - [facility] -     Magnesium  -     Thyroid Stimulating-Hormone (TSH)  Recurrent cold sores  Eczema, unspecified type  Screening for tuberculosis -     QuantiFERON-TB Gold Plus - [facility]  Other orders -     albuterol  MDI, PROVENTIL , VENTOLIN , PROAIR , HFA 90 mcg/actuation inhaler;  Inhale 2 inhalations into the lungs every 6 (six) hours as needed for Wheezing -     ondansetron  (ZOFRAN -ODT) 4 MG disintegrating tablet; Take 1 tablet (4 mg total) by mouth every 8 (eight) hours as needed for Nausea -     EPINEPHrine  (EPIPEN ) 0.3 mg/0.3 mL auto-injector; Inject 0.3 mLs (0.3 mg total) into the muscle once as needed for up to 1 dose -     propranoloL (INDERAL) 10 MG tablet; Take 1 tablet (10 mg total) by mouth 2 (two) times daily -     nicotine  (NICODERM CQ ) 14 mg/24 hr patch; Place 1 patch onto the skin daily for 14 days    Assessment & Plan Asthma Severe  persistent asthma exacerbated by summer humidity. Reports increased phlegm in the mornings but no current wheezing. Dulera  is effective but requires consistent use as a maintenance medication. Discussed the interaction between propranolol and albuterol , advising to avoid concurrent use to maintain inhaler efficacy. - Refill Albuterol  inhaler - Ensure consistent use of Dulera  as a maintenance medication - Send in EpiPen  two-pack as a precaution - Order nebulizer refill if needed  Environmental Allergies Experiences morning phlegm and allergy symptoms, particularly in the summer. Zyrtec is part of the daily medication regimen. - Continue Zyrtec as part of daily medication regimen  Generalized Anxiety Disorder (GAD) Experiences anxiety, particularly test anxiety. Previously tried guanfacine but discontinued due to side effects. Propranolol was effective for situational anxiety. Discussed the interaction between propranolol and albuterol , advising to avoid concurrent use. - Prescribe propranolol 10 mg as needed for situational anxiety, with caution to avoid concurrent use with Albuterol  inhaler  Attention Deficit Hyperactivity Disorder (ADHD) Managing ADHD with Vyvanse. Discussed the need for documentation to support accommodations for testing if needed. - Continue Vyvanse as prescribed  Nicotine  Dependence Attempting to quit vaping and is open to using nicotine  patches. Reports difficulty quitting despite trying non-nicotine  vapes. Informed about the potential for vivid dreams if patches are worn overnight. 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 Reports being a severely picky eater and found previous nutritional advice unhelpful.  General Health Maintenance Up to date on immunizations but requires Quantiferon test for school. Labs  were last done in February, with vitamin D noted as low. Discussed ordering additional labs to monitor health status. - Order Quantiferon test - Order CBC, CMP, vitamin B12, vitamin D, magnesium , and thyroid labs - Print immunization records  Follow-up Requires follow-up for lab results and ongoing management of conditions. - Follow up after lab results are available - Ensure form is completed and submitted by July 15   6 months for PE, sooner if needed.   This note has been created using automated tools and reviewed for accuracy by provider.  Patient received an After Visit Summary    Attestation Statement:   I personally performed the service, non-incident to. (WP)   GLENDA MACARIO HADDOCK, NP

## 2024-04-15 ENCOUNTER — Ambulatory Visit
Admission: EM | Admit: 2024-04-15 | Discharge: 2024-04-15 | Disposition: A | Discharge status: Home / Self Care | Attending: Emergency Medicine | Admitting: Emergency Medicine

## 2024-04-15 DIAGNOSIS — R0602 Shortness of breath: Secondary | ICD-10-CM

## 2024-04-15 DIAGNOSIS — J4541 Moderate persistent asthma with (acute) exacerbation: Secondary | ICD-10-CM

## 2024-04-15 DIAGNOSIS — R062 Wheezing: Secondary | ICD-10-CM

## 2024-04-15 LAB — POC COVID19/FLU A&B COMBO
Covid Antigen, POC: NEGATIVE
Influenza A Antigen, POC: NEGATIVE
Influenza B Antigen, POC: NEGATIVE

## 2024-04-15 MED ORDER — METHYLPREDNISOLONE SODIUM SUCC 125 MG IJ SOLR
80.0000 mg | Freq: Once | INTRAMUSCULAR | Status: AC
  Administered 2024-04-15: 80 mg via INTRAMUSCULAR

## 2024-04-15 MED ORDER — IPRATROPIUM-ALBUTEROL 0.5-2.5 (3) MG/3ML IN SOLN
3.0000 mL | Freq: Once | RESPIRATORY_TRACT | Status: AC
  Administered 2024-04-15: 3 mL via RESPIRATORY_TRACT

## 2024-04-15 MED ORDER — IPRATROPIUM-ALBUTEROL 0.5-2.5 (3) MG/3ML IN SOLN: 3.0000 mL | Freq: Four times a day (QID) | RESPIRATORY_TRACT | 0 refills | Status: AC | PRN

## 2024-04-15 MED ORDER — PREDNISONE 10 MG PO TABS: 40.0000 mg | ORAL_TABLET | Freq: Every day | ORAL | 0 refills | Status: AC

## 2024-04-15 NOTE — ED Provider Notes (Signed)
 Angela Mckenzie    CSN: [id number] Arrival date & time: 04/15/24  1519      History   Chief Complaint Chief Complaint  Patient presents with   Cough   Nasal Congestion    HPI Angela Mckenzie is a 23 y.o. female.  Patient presents with 1 day history of fever, congestion, sinus pressure, cough, wheezing, shortness of breath.  Tmax 101.  No vomiting or diarrhea.  She has been treating her symptoms with ibuprofen, Tylenol , albuterol  nebulizer treatments.  Last albuterol  nebulizer treatment 45 minutes ago.  Her medical history includes moderate persistent asthma.   The history is provided by the patient and medical records.    Past Medical History:  Diagnosis Date   Asthma     Patient Active Problem List   Diagnosis Date Noted   Acute severe asthma 09/15/2020    Past Surgical History:  Procedure Laterality Date   BREAST LUMPECTOMY     WISDOM TOOTH EXTRACTION      OB History   No obstetric history on file.      Home Medications    Prior to Admission medications   Medication Sig Start Date End Date Taking? Authorizing Provider  ipratropium-albuterol  (DUONEB) 0.5-2.5 (3) MG/3ML SOLN Take 3 mLs by nebulization every 6 (six) hours as needed. 04/15/24  Yes Corlis Burnard DEL, NP  predniSONE  (DELTASONE ) 10 MG tablet Take 4 tablets (40 mg total) by mouth daily for 5 days. 04/16/24 04/21/24 Yes Corlis Burnard DEL, NP  albuterol  (PROVENTIL ) (2.5 MG/3ML) 0.083% nebulizer solution Take 3 mLs (2.5 mg total) by nebulization every 4 (four) hours as needed for wheezing or shortness of breath. 06/22/23 06/21/24  Angelena Smalls, MD  albuterol  (VENTOLIN  HFA) 108 (90 Base) MCG/ACT inhaler Inhale 2 puffs into the lungs every 4 (four) hours as needed for wheezing or shortness of breath. 06/22/23   Angelena Smalls, MD  cetirizine (ZYRTEC) 10 MG tablet Take 10 mg by mouth daily.    [provider]  EPINEPHrine  0.3 mg/0.3 mL IJ SOAJ injection  01/31/18   [provider]  fluticasone  (FLONASE) 50 MCG/ACT nasal spray Place 1 spray into both nostrils 2 (two) times daily. 07/18/21   [provider]  mometasone -formoterol  (DULERA ) 200-5 MCG/ACT AERO Inhale 2 puffs into the lungs 2 (two) times daily. 06/22/23   Angelena Smalls, MD  montelukast  (SINGULAIR ) 10 MG tablet Take 1 tablet (10 mg total) by mouth at bedtime. 09/16/20   Drusilla Sabas RAMAN, MD  norgestimate-ethinyl estradiol (ORTHO-CYCLEN) 0.25-35 MG-MCG tablet Take 1 tablet by mouth daily. 08/19/20   [provider]  ondansetron  (ZOFRAN  ODT) 4 MG disintegrating tablet Take 1 tablet (4 mg total) by mouth every 8 (eight) hours as needed for nausea or vomiting. 06/30/18   Emil Share, DO  valACYclovir (VALTREX) 500 MG tablet Take 500 mg by mouth daily. 12/05/17   [provider]    Family History History reviewed. No pertinent family history.  Social History Social History   Tobacco Use   Smoking status: Former    Current packs/day: 0.00    Types: Cigarettes    Quit date: 10/22/2019    Years since quitting: 4.4   Smokeless tobacco: Never   Tobacco comments:    quit in 2021  Vaping Use   Vaping status: Every Day  Substance Use Topics   Alcohol use: Not Currently   Drug use: Not Currently    Types: Marijuana    Comment: Delta 8     Allergies  Justicia adhatoda (malabar nut tree) [justicia adhatoda] and Remeron [mirtazapine]   Review of Systems Review of Systems  Constitutional:  Positive for fever. Negative for chills.  HENT:  Positive for congestion, rhinorrhea and sinus pressure. Negative for ear pain and sore throat.   Respiratory:  Positive for cough, shortness of breath and wheezing.   Cardiovascular:  Negative for chest pain and palpitations.  Gastrointestinal:  Negative for diarrhea and vomiting.     Physical Exam Triage Vital Signs ED Triage Vitals  Encounter Vitals Group     BP      Girls Systolic BP Percentile      Girls Diastolic BP Percentile      Boys Systolic BP  Percentile      Boys Diastolic BP Percentile      Pulse      Resp      Temp      Temp src      SpO2      Weight      Height      Head Circumference      Peak Flow      Pain Score      Pain Loc      Pain Education      Exclude from Growth Chart    No data found.  Updated Vital Signs BP 126/88   Pulse (!) 107   Temp 98.1 F (36.7 C)   Resp 18   LMP 04/08/2024   SpO2 96%   Visual Acuity Right Eye Distance:   Left Eye Distance:   Bilateral Distance:    Right Eye Near:   Left Eye Near:    Bilateral Near:     Physical Exam Constitutional:      General: She is not in acute distress. HENT:     Right Ear: Tympanic membrane normal.     Left Ear: Tympanic membrane normal.     Nose: Nose normal.     Mouth/Throat:     Mouth: Mucous membranes are moist.     Pharynx: Oropharynx is clear.  Cardiovascular:     Rate and Rhythm: Regular rhythm. Tachycardia present.     Heart sounds: Normal heart sounds.  Pulmonary:     Effort: Pulmonary effort is normal.     Breath sounds: Decreased air movement present. Wheezing present.     Comments: DuoNeb given here.  After nebulizer treatment, improved air movement and no wheezes; patient reports decreased shortness of breath. Neurological:     Mental Status: She is alert.      UC Treatments / Results  Labs (all labs ordered are listed, but only abnormal results are displayed) Labs Reviewed  POC COVID19/FLU A&B COMBO - Normal    EKG   Radiology No results found.  Procedures Procedures (including critical care time)  Medications Ordered in UC Medications  methylPREDNISolone  sodium succinate (SOLU-MEDROL ) 125 mg/2 mL injection 80 mg (80 mg Intramuscular Given 04/15/24 1550)  ipratropium-albuterol  (DUONEB) 0.5-2.5 (3) MG/3ML nebulizer solution 3 mL (3 mLs Nebulization Given 04/15/24 1550)    Initial Impression / Assessment and Plan / UC Course  I have reviewed the triage vital signs and the nursing notes.  Pertinent  labs & imaging results that were available during my care of the patient were reviewed by me and considered in my medical decision making (see chart for details).   Asthma exacerbation, wheezing, shortness of breath.  O2 sat 96% on room air.  Heart rate 126 but patient just had an albuterol   nebulizer treatment at home.  Patient declines transfer to the ED.  She declines EKG and chest x-ray.  DuoNeb given here; improved air movement and no wheezes after nebulizer treatment; patient reports great improvement in her shortness of breath.  Solu-Medrol  given here and starting prednisone  tomorrow.  Instructed her to follow-up with primary care tomorrow.  Strict ED precautions given.  Education provided on asthma and shortness of breath.  She agrees to plan of care.  Final Clinical Impressions(s) / UC Diagnoses   Final diagnoses:  Moderate persistent asthma with acute exacerbation  Wheezing  Shortness of breath     Discharge Instructions      Follow up with your primary care provider tomorrow.  Go to the emergency department if you have worsening symptoms.    You were given an injection of Solu-Medrol  today.  Start the prednisone  tomorrow.    You were given a DuoNeb today.  DuoNeb nebulizer solution prescription was sent to your pharmacy also.    Your COVID and flu tests are negative.     ED Prescriptions     Medication Sig Dispense Auth. Provider   ipratropium-albuterol  (DUONEB) 0.5-2.5 (3) MG/3ML SOLN Take 3 mLs by nebulization every 6 (six) hours as needed. 360 mL Corlis Burnard DEL, NP   predniSONE  (DELTASONE ) 10 MG tablet Take 4 tablets (40 mg total) by mouth daily for 5 days. 20 tablet Corlis Burnard DEL, NP      PDMP not reviewed this encounter.   Corlis Burnard DEL, NP 04/15/24 [id number]

## 2024-04-15 NOTE — ED Triage Notes (Signed)
 Patient to Urgent Care with complaints of cough/ nasal and chest congestion/ facial pressure/ wheezing. Temp 101 last night.  Symptoms started yesterday.   Meds: rotating tylenol  and ibuprofen. Albuterol  nebs last one 45 minutes ago.

## 2024-04-15 NOTE — Discharge Instructions (Addendum)
 Follow up with your primary care provider tomorrow.  Go to the emergency department if you have worsening symptoms.    You were given an injection of Solu-Medrol  today.  Start the prednisone  tomorrow.    You were given a DuoNeb today.  DuoNeb nebulizer solution prescription was sent to your pharmacy also.    Your COVID and flu tests are negative.

## 2024-04-16 NOTE — Progress Notes (Signed)
 History of Present Illness:   Angela Mckenzie is a 23 y.o. female here for   Verbally consented to the use of AI for note-taking.   Chief Complaint  Patient presents with  . Cough   History of Present Illness Angela Mckenzie is a 23 year old female with asthma who presents with respiratory symptoms  She was seen at urgent care the previous day and received a DuoNeb nebulizer treatment and a steroid injection. Overnight, she was unable to obtain her prescribed DuoNeb from the pharmacy and instead used albuterol  for her nebulizer, Nyquil, and melatonin. Despite these measures, she was unable to sleep and experienced frequent awakenings due to coughing and difficulty breathing.  Her oxygen saturation levels have been in the low nineties, occasionally reaching ninety-five when she is fully relaxed and sitting up. She describes her breathing as labored, with pain in her chest, back, shoulders, and throat due to coughing. She notes a sensation of 'wet, heavy' lungs. Her symptoms began with a sore throat, which has since improved, but she continues to experience significant respiratory symptoms. Her right ear and lymph node are painful, and she has been able to eat and drink without difficulty.  She has been using her albuterol  inhaler and Dulera  frequently, and her last breathing treatment was at 5 AM. She describes her cough as non-productive when lying flat, leading to gagging sensations. She has a history of asthma, with her last hospitalization for an asthma exacerbation occurring on January 1st of this year.   During the review of symptoms, she reports feeling clammy with sweaty hands and feet, and notes tingling in her hands from excessive albuterol  use. She mentions feeling panicky with her breathing difficulties. She has been using her rescue inhaler frequently and has had multiple albuterol  nebulizer treatments since the previous day.   Past Medical History:   Past Medical History:  Diagnosis  Date  . Acute severe asthma (HHS-HCC) 09/15/2020  . Allergy   . Asthma, unspecified asthma severity, unspecified whether complicated, unspecified whether persistent (HHS-HCC)     Past Surgical History:   Past Surgical History:  Procedure Laterality Date  . Lump ectomy Left    Breast beign  . wisdom teeth removal      Allergies:   Allergies  Allergen Reactions  . Peanut Anaphylaxis  . 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 Mirtazapine Other (See Comments)    Urinary retention  . Allergenic Extracts Unknown    Current Medications:   Prior to Admission medications  Medication Sig Taking? Last Dose  albuterol  (PROVENTIL ) 2.5 mg /3 mL (0.083 %) nebulizer solution albuterol  sulfate 2.5 mg/3 mL (0.083 %) solution for nebulization  USE 1 VIAL VIA NEBULIZER EVERY 4-6 HOURS AS NEEDED FOR COUGHING OR WHEEZING Yes Taking  albuterol  MDI, PROVENTIL , VENTOLIN , PROAIR , HFA 90 mcg/actuation inhaler Inhale 2 inhalations into the lungs every 6 (six) hours as needed for Wheezing Yes Taking  azelastine (OPTIVAR) 0.05 % ophthalmic solution  Yes Taking  lisdexamfetamine (VYVANSE) 40 MG capsule Take 1 capsule (40 mg total) by mouth every morning Yes Taking  montelukast  (SINGULAIR ) 10 mg tablet  Yes Taking  norethindrone-ethinyl estradiol (JUNEL 1.5/30) 1.5-30 mg-mcg tablet Take 1 tablet by mouth once daily Yes Taking  ondansetron  (ZOFRAN -ODT) 4 MG disintegrating tablet Take 1 tablet (4 mg total) by mouth every 8 (eight) hours as needed for Nausea Yes Taking  propranoloL (INDERAL) 10 MG tablet Take 1 tablet (10 mg total) by mouth 2 (  two) times daily Yes Taking  valACYclovir (VALTREX) 1000 MG tablet Take 1 tablet (1,000 mg total) by mouth once daily Yes Taking  azithromycin  (ZITHROMAX ) 250 MG tablet 2 tabs on day one, then 1 tab daily x 4 days    budesonide (PULMICORT) 1 mg/2 mL nebulizer solution Take 2 mLs (1 mg total) by nebulization once daily    mometasone -formoterol  (DULERA )  200-5 mcg/actuation inhaler Inhale 2 inhalations into the lungs 2 (two) times daily      Family History:   Family History  Problem Relation Name Age of Onset  . High blood pressure (Hypertension) Mother    . Hyperlipidemia (Elevated cholesterol) Mother    . Diabetes Mother    . Bladder Cancer Father    . Kidney cancer Father    . High blood pressure (Hypertension) Father    . Hyperlipidemia (Elevated cholesterol) Father      Social History:   Social History   Socioeconomic History  . Marital status: Single  Tobacco Use  . Smoking status: Never  . Smokeless tobacco: Never  . Tobacco comments:    Vapes  Vaping Use  . Vaping status: Every Day  Substance and Sexual Activity  . Alcohol use: Not Currently    Comment: rarely  . Drug use: Never  . Sexual activity: Yes    Partners: Male    Birth control/protection: OCP   Social Drivers of Health   Financial Resource Strain: High Risk (03/13/2024)   Overall Financial Resource Strain (CARDIA)   . Difficulty of Paying Living Expenses: Hard  Food Insecurity: Food Insecurity Present (03/13/2024)   Hunger Vital Sign   . Worried About Programme Researcher, Broadcasting/film/video in the Last Year: Never true   . Ran Out of Food in the Last Year: Sometimes true  Transportation Needs: No Transportation Needs (03/13/2024)   PRAPARE - Transportation   . Lack of Transportation (Medical): No   . Lack of Transportation (Non-Medical): No   Received [facility]   Social Network  Housing Stability: High Risk (03/13/2024)   Housing Stability Vital Sign   . Unable to Pay for Housing in the Last Year: Yes   . Number of Times Moved in the Last Year: 0   . Homeless in the Last Year: No    Review of Systems:   A 10 point review of systems is negative, except for the pertinent positives and negatives detailed in the HPI.  Vitals:   Vitals:   04/16/24 0831  BP: 120/74  Temp: 36.9 C (98.4 F)  SpO2: 91%  Weight: 55.7 kg (122 lb 12.8 oz)     Body mass  index is 19.82 kg/m.  Physical Exam:   Physical Exam Vitals and nursing note reviewed.  Constitutional:      General: She is not in acute distress.    Appearance: Normal appearance. She is not ill-appearing, toxic-appearing or diaphoretic.  HENT:     Head: Normocephalic and atraumatic.     Right Ear: External ear normal.     Left Ear: External ear normal.  Eyes:     General:        Right eye: No discharge.        Left eye: No discharge.     Conjunctiva/sclera: Conjunctivae normal.  Cardiovascular:     Rate and Rhythm: Normal rate and regular rhythm.     Pulses: Normal pulses.     Heart sounds: Normal heart sounds. No murmur heard.    No friction rub.  No gallop.  Pulmonary:     Effort: Pulmonary effort is normal. No respiratory distress.     Breath sounds: Decreased air movement present. No stridor. Examination of the right-upper field reveals wheezing. Examination of the left-upper field reveals wheezing. Examination of the right-middle field reveals wheezing. Examination of the left-middle field reveals wheezing. Examination of the right-lower field reveals wheezing. Examination of the left-lower field reveals wheezing. Wheezing present. No rhonchi or rales.     Comments: Inspiratory and expiratory wheezing noted throughout bilateral lungs with decreased air movement. Chest:     Chest wall: No tenderness.  Skin:    General: Skin is warm and dry.     Capillary Refill: Capillary refill takes less than 2 seconds.  Neurological:     Mental Status: She is alert.     Assessment and Plan:   Results for orders placed or performed in visit on 04/16/24  Extended Respiratory Viral Panel - [facility]   Specimen: Nasal Swab; Other  Result Value Ref Range   Influenza A PCR Negative Negative   Influenza B PCR Negative Negative   RSV PCR Negative Negative   SARS-CoV2 PCR Negative Negative   Narrative   Positive  Positive results are indicative of the presence of the identified  virus, but do not rule out bacterial infection or co-infection with other pathogens not detected by the test. Clinical correlation with patient history and other diagnostic information is necessary to determine patient infection status.  Negative  Negative results do not preclude SARS-CoV-2 infection, Influenza A virus, Influenza B virus and/or RSV infection and should not be used as the sole basis for treatment or other patient management decisions. Negative results must be combined with clinical observations, patient history, and/or epidemiological information.       Diagnoses and all orders for this visit:  Encntr for obs for susp expsr to oth biolg agents ruled out -     Extended Respiratory Viral Panel - [facility]; Future -     Discontinue: triamcinolone acetonide (KENALOG-40) injection 60 mg -     methylPREDNISolone  (PF) (SOLU-Medrol ) 40 mg/mL injection 40 mg  Shortness of breath -     ipratropium-albuteroL  (DUO-NEB) nebulizer solution 3 mL -     Discontinue: methylPREDNISolone  (SOLU-Medrol ) dry powder 40 mg -     methylPREDNISolone  (PF) (SOLU-Medrol ) 40 mg/mL injection 40 mg  Mild intermittent asthma with exacerbation (HHS-HCC) -     ipratropium-albuteroL  (DUO-NEB) nebulizer solution 3 mL -     Discontinue: triamcinolone acetonide (KENALOG-40) injection 60 mg -     Discontinue: methylPREDNISolone  (SOLU-Medrol ) dry powder 40 mg -     methylPREDNISolone  (PF) (SOLU-Medrol ) 40 mg/mL injection 40 mg  Other orders -     azithromycin  (ZITHROMAX ) 250 MG tablet; 2 tabs on day one, then 1 tab daily x 4 days   Assessment & Plan Asthma with acute exacerbation and acute lower respiratory infection (possible bronchitis or pneumonia) Acute exacerbation of asthma with symptoms of dyspnea, wheezing, and chest tightness. Oxygen saturation in the 90s, improving to 95% with oxygen. Differential includes bronchitis or pneumonia, given symptoms of productive cough, chest congestion, and sore  throat. Recent flu test positive, RSV not tested. Concerns about potential pneumonia due to wet, heavy sensation in lungs. Significant use of albuterol  leading to tingling in hands. Consideration of ER visit due to persistent symptoms and potential need for further workup including chest x-ray and EKG. - Administered Solumedrol injection in office - Provided oxygen therapy. - Ordered COVID, flu, and  RSV tests. - Prescribed Z-Pak (azithromycin ). - Advised to pick up DuoNeb from pharmacy. - Discussed potential ER visit if symptoms persist or worsen.   Follow up She will call the office or go to Emergency Room with any worsening symptoms.   This note has been created using automated tools and reviewed for accuracy by provider.  Patient received an After Visit Summary    Attestation Statement:   I personally performed the service, non-incident to. (WP)   GLENDA MACARIO HADDOCK, NP

## 2024-04-17 ENCOUNTER — Emergency Department

## 2024-04-17 ENCOUNTER — Inpatient Hospital Stay
Admission: EM | Admit: 2024-04-17 | Discharge: 2024-04-19 | DRG: 189 | Disposition: A | Discharge status: Home / Self Care | Attending: Internal Medicine | Admitting: Internal Medicine

## 2024-04-17 ENCOUNTER — Other Ambulatory Visit: Payer: Self-pay

## 2024-04-17 ENCOUNTER — Observation Stay

## 2024-04-17 DIAGNOSIS — B349 Viral infection, unspecified: Secondary | ICD-10-CM | POA: Diagnosis present

## 2024-04-17 DIAGNOSIS — Z91018 Allergy to other foods: Secondary | ICD-10-CM | POA: Diagnosis not present

## 2024-04-17 DIAGNOSIS — R1310 Dysphagia, unspecified: Secondary | ICD-10-CM | POA: Diagnosis present

## 2024-04-17 DIAGNOSIS — H1010 Acute atopic conjunctivitis, unspecified eye: Secondary | ICD-10-CM | POA: Diagnosis present

## 2024-04-17 DIAGNOSIS — J45901 Unspecified asthma with (acute) exacerbation: Secondary | ICD-10-CM | POA: Diagnosis not present

## 2024-04-17 DIAGNOSIS — J9601 Acute respiratory failure with hypoxia: Secondary | ICD-10-CM | POA: Diagnosis present

## 2024-04-17 DIAGNOSIS — Z9109 Other allergy status, other than to drugs and biological substances: Secondary | ICD-10-CM

## 2024-04-17 DIAGNOSIS — R0902 Hypoxemia: Secondary | ICD-10-CM | POA: Diagnosis present

## 2024-04-17 DIAGNOSIS — F5082 Avoidant/restrictive food intake disorder: Secondary | ICD-10-CM

## 2024-04-17 DIAGNOSIS — F17200 Nicotine dependence, unspecified, uncomplicated: Secondary | ICD-10-CM | POA: Diagnosis not present

## 2024-04-17 DIAGNOSIS — J189 Pneumonia, unspecified organism: Secondary | ICD-10-CM | POA: Diagnosis present

## 2024-04-17 DIAGNOSIS — Z7951 Long term (current) use of inhaled steroids: Secondary | ICD-10-CM

## 2024-04-17 DIAGNOSIS — Z888 Allergy status to other drugs, medicaments and biological substances status: Secondary | ICD-10-CM

## 2024-04-17 DIAGNOSIS — Z87892 Personal history of anaphylaxis: Secondary | ICD-10-CM

## 2024-04-17 DIAGNOSIS — R000 Tachycardia, unspecified: Secondary | ICD-10-CM | POA: Diagnosis present

## 2024-04-17 DIAGNOSIS — D72829 Elevated white blood cell count, unspecified: Secondary | ICD-10-CM

## 2024-04-17 DIAGNOSIS — J4531 Mild persistent asthma with (acute) exacerbation: Secondary | ICD-10-CM | POA: Diagnosis not present

## 2024-04-17 DIAGNOSIS — Z91010 Allergy to peanuts: Secondary | ICD-10-CM | POA: Diagnosis not present

## 2024-04-17 DIAGNOSIS — J4551 Severe persistent asthma with (acute) exacerbation: Secondary | ICD-10-CM | POA: Diagnosis present

## 2024-04-17 DIAGNOSIS — J9811 Atelectasis: Secondary | ICD-10-CM | POA: Diagnosis present

## 2024-04-17 DIAGNOSIS — F17290 Nicotine dependence, other tobacco product, uncomplicated: Secondary | ICD-10-CM | POA: Diagnosis present

## 2024-04-17 LAB — CBC WITH DIFFERENTIAL/PLATELET
Abs Immature Granulocytes: 0.12 K/uL — ABNORMAL HIGH (ref 0.00–0.07)
Basophils Absolute: 0.1 K/uL (ref 0.0–0.1)
Basophils Relative: 0 %
Eosinophils Absolute: 0.1 K/uL (ref 0.0–0.5)
Eosinophils Relative: 0 %
HCT: 39.3 % (ref 36.0–46.0)
Hemoglobin: 12.9 g/dL (ref 12.0–15.0)
Immature Granulocytes: 1 %
Lymphocytes Relative: 13 %
Lymphs Abs: 2.5 K/uL (ref 0.7–4.0)
MCH: 30.1 pg (ref 26.0–34.0)
MCHC: 32.8 g/dL (ref 30.0–36.0)
MCV: 91.8 fL (ref 80.0–100.0)
Monocytes Absolute: 0.8 K/uL (ref 0.1–1.0)
Monocytes Relative: 4 %
Neutro Abs: 15.4 K/uL — ABNORMAL HIGH (ref 1.7–7.7)
Neutrophils Relative %: 82 %
Platelets: 331 K/uL (ref 150–400)
RBC: 4.28 MIL/uL (ref 3.87–5.11)
RDW: 12.9 % (ref 11.5–15.5)
WBC: 18.9 K/uL — ABNORMAL HIGH (ref 4.0–10.5)
nRBC: 0 % (ref 0.0–0.2)

## 2024-04-17 LAB — BASIC METABOLIC PANEL WITH GFR
Anion gap: 10 (ref 5–15)
BUN: 21 mg/dL — ABNORMAL HIGH (ref 6–20)
CO2: 20 mmol/L — ABNORMAL LOW (ref 22–32)
Calcium: 8.9 mg/dL (ref 8.9–10.3)
Chloride: 111 mmol/L (ref 98–111)
Creatinine, Ser: 0.52 mg/dL (ref 0.44–1.00)
GFR, Estimated: >60 mL/min (ref >60)
Glucose, Bld: 100 mg/dL — ABNORMAL HIGH (ref 70–99)
Potassium: 3.8 mmol/L (ref 3.5–5.1)
Sodium: 141 mmol/L (ref 135–145)

## 2024-04-17 LAB — D-DIMER, QUANTITATIVE: D-Dimer, Quant: <0.27 ug{FEU}/mL (ref 0.00–0.50)

## 2024-04-17 LAB — HIV ANTIBODY (ROUTINE TESTING W REFLEX): HIV Screen 4th Generation wRfx: NONREACTIVE

## 2024-04-17 MED ORDER — NICOTINE 7 MG/24HR TD PT24
7.0000 mg | MEDICATED_PATCH | Freq: Every day | TRANSDERMAL | Status: DC
  Administered 2024-04-17 – 2024-04-18 (×2): 7 mg via TRANSDERMAL
  Filled 2024-04-17 (×2): qty 1

## 2024-04-17 MED ORDER — ONDANSETRON HCL 4 MG PO TABS: 4.0000 mg | ORAL_TABLET | Freq: Four times a day (QID) | ORAL | Status: DC | PRN

## 2024-04-17 MED ORDER — PREDNISONE 20 MG PO TABS
40.0000 mg | ORAL_TABLET | Freq: Every day | ORAL | Status: DC
  Administered 2024-04-19: 40 mg via ORAL
  Filled 2024-04-17: qty 2

## 2024-04-17 MED ORDER — ALBUTEROL SULFATE (2.5 MG/3ML) 0.083% IN NEBU
20.0000 mg | INHALATION_SOLUTION | RESPIRATORY_TRACT | Status: AC
  Administered 2024-04-17: 20 mg via RESPIRATORY_TRACT
  Filled 2024-04-17 (×3): qty 24

## 2024-04-17 MED ORDER — GUAIFENESIN-DM 100-10 MG/5ML PO SYRP
5.0000 mL | ORAL_SOLUTION | ORAL | Status: DC | PRN
  Administered 2024-04-18 – 2024-04-19 (×2): 5 mL via ORAL
  Filled 2024-04-17 (×3): qty 10

## 2024-04-17 MED ORDER — MORPHINE SULFATE (PF) 2 MG/ML IV SOLN
2.0000 mg | INTRAVENOUS | Status: DC | PRN
  Administered 2024-04-17 – 2024-04-19 (×7): 2 mg via INTRAVENOUS
  Filled 2024-04-17 (×7): qty 1

## 2024-04-17 MED ORDER — IPRATROPIUM-ALBUTEROL 0.5-2.5 (3) MG/3ML IN SOLN
9.0000 mL | Freq: Once | RESPIRATORY_TRACT | Status: AC
  Administered 2024-04-17: 9 mL via RESPIRATORY_TRACT
  Filled 2024-04-17: qty 9

## 2024-04-17 MED ORDER — LEVALBUTEROL HCL 0.63 MG/3ML IN NEBU
0.6300 mg | INHALATION_SOLUTION | RESPIRATORY_TRACT | Status: DC
  Administered 2024-04-17 – 2024-04-18 (×8): 0.63 mg via RESPIRATORY_TRACT
  Filled 2024-04-17 (×11): qty 3

## 2024-04-17 MED ORDER — SODIUM CHLORIDE 0.9 % IV BOLUS
1000.0000 mL | Freq: Once | INTRAVENOUS | Status: AC
  Administered 2024-04-17: 1000 mL via INTRAVENOUS

## 2024-04-17 MED ORDER — KETOROLAC TROMETHAMINE 15 MG/ML IJ SOLN
15.0000 mg | Freq: Once | INTRAMUSCULAR | Status: AC
  Administered 2024-04-17: 15 mg via INTRAVENOUS
  Filled 2024-04-17: qty 1

## 2024-04-17 MED ORDER — IPRATROPIUM-ALBUTEROL 0.5-2.5 (3) MG/3ML IN SOLN
3.0000 mL | RESPIRATORY_TRACT | Status: DC | PRN
  Administered 2024-04-17: 3 mL via RESPIRATORY_TRACT
  Filled 2024-04-17: qty 3

## 2024-04-17 MED ORDER — LACTATED RINGERS IV BOLUS
1000.0000 mL | Freq: Once | INTRAVENOUS | Status: AC
Start: 2024-04-17 — End: 2024-04-17
  Administered 2024-04-17: 1000 mL via INTRAVENOUS

## 2024-04-17 MED ORDER — TRAZODONE HCL 50 MG PO TABS
25.0000 mg | ORAL_TABLET | Freq: Every evening | ORAL | Status: DC | PRN
Start: 2024-04-17 — End: 2024-04-19
  Administered 2024-04-17 – 2024-04-18 (×2): 25 mg via ORAL
  Filled 2024-04-17 (×2): qty 1

## 2024-04-17 MED ORDER — SODIUM CHLORIDE 0.9 % IV SOLN
2.0000 g | INTRAVENOUS | Status: DC
  Administered 2024-04-17 – 2024-04-18 (×2): 2 g via INTRAVENOUS
  Filled 2024-04-17 (×3): qty 20

## 2024-04-17 MED ORDER — MAGNESIUM SULFATE 2 GM/50ML IV SOLN
2.0000 g | Freq: Once | INTRAVENOUS | Status: AC
  Administered 2024-04-17: 2 g via INTRAVENOUS
  Filled 2024-04-17: qty 50

## 2024-04-17 MED ORDER — ENOXAPARIN SODIUM 40 MG/0.4ML IJ SOSY
40.0000 mg | PREFILLED_SYRINGE | INTRAMUSCULAR | Status: DC
  Administered 2024-04-19: 40 mg via SUBCUTANEOUS
  Filled 2024-04-17 (×2): qty 0.4

## 2024-04-17 MED ORDER — ONDANSETRON HCL 4 MG/2ML IJ SOLN: 4.0000 mg | Freq: Four times a day (QID) | INTRAMUSCULAR | Status: DC | PRN

## 2024-04-17 MED ORDER — HYDROCOD POLI-CHLORPHE POLI ER 10-8 MG/5ML PO SUER
5.0000 mL | Freq: Two times a day (BID) | ORAL | Status: DC | PRN
  Administered 2024-04-17 – 2024-04-18 (×3): 5 mL via ORAL
  Filled 2024-04-17 (×3): qty 5

## 2024-04-17 MED ORDER — METHYLPREDNISOLONE SODIUM SUCC 125 MG IJ SOLR
125.0000 mg | Freq: Once | INTRAMUSCULAR | Status: AC
  Administered 2024-04-17: 125 mg via INTRAVENOUS
  Filled 2024-04-17: qty 2

## 2024-04-17 MED ORDER — KETOROLAC TROMETHAMINE 15 MG/ML IJ SOLN
15.0000 mg | Freq: Four times a day (QID) | INTRAMUSCULAR | Status: DC | PRN
  Administered 2024-04-17 – 2024-04-18 (×4): 15 mg via INTRAVENOUS
  Filled 2024-04-17 (×4): qty 1

## 2024-04-17 MED ORDER — METHYLPREDNISOLONE SODIUM SUCC 40 MG IJ SOLR
40.0000 mg | Freq: Two times a day (BID) | INTRAMUSCULAR | Status: AC
  Administered 2024-04-17 – 2024-04-18 (×2): 40 mg via INTRAVENOUS
  Filled 2024-04-17 (×2): qty 1

## 2024-04-17 MED ORDER — SODIUM CHLORIDE 0.9 % IV SOLN
500.0000 mg | INTRAVENOUS | Status: DC
  Administered 2024-04-17: 500 mg via INTRAVENOUS
  Filled 2024-04-17 (×2): qty 5

## 2024-04-17 MED ORDER — DOXYCYCLINE HYCLATE 100 MG PO TABS
100.0000 mg | ORAL_TABLET | Freq: Once | ORAL | Status: AC
  Administered 2024-04-17: 100 mg via ORAL
  Filled 2024-04-17: qty 1

## 2024-04-17 NOTE — Assessment & Plan Note (Signed)
 White count 18 on presentation Suspect likel secondary to excessive steroid use No overt infection noted Will otherwise monitor for now Trend with treatment Reassess as appropriate

## 2024-04-17 NOTE — H&P (Addendum)
 History and Physical    Patient: Angela Mckenzie FMW:[id number] DOB: August 12, 2000 DOA: 04/17/2024 DOS: the patient was seen and examined on 04/17/2024 PCP: Harvey Gaetana CROME, NP  Patient coming from: Home  Chief Complaint:  Chief Complaint  Patient presents with   Shortness of Breath   HPI: Angela Mckenzie is a 23 y.o. female with medical history significant of asthma, vaping presenting with acute respiratory failure with hypoxia leukocytosis and asthma exacerbation.  Patient reports developing URI symptoms roughly 4 to 5 days ago.  Initial symptoms were rhinorrhea nasal congestion and cough.  Patient states that rhinorrhea and congestion moved to predominantly in the lower respiratory tract with worsening cough, increased work of breathing and wheezing.  Symptoms progressively worsen over the past 2 to 3 days.  Initially went to the urgent care for symptoms.  Was given IM steroids as well as a breathing treatment with minimal improvement in symptoms.  Was seen by PCP and given a course of a Z-Pak as well as another IM steroid shot yesterday.  Has not started Z-Pak as of yet.  Positive pleuritic chest pain worse with coughing and deep breathing.  Mild trouble swallowing.  No trismus.  Minimal headache.  No abdominal pain or diarrhea.  Still vaping albeit at a much reduced rate in setting of active symptoms. Presented to the ER afebrile, heart rate in the 100s, BP stable.  Initially satting 70% on room air.  Transition to high flow nasal cannula at 35 L/min and 60% FiO2. CXR WNL. Review of Systems: As mentioned in the history of present illness. All other systems reviewed and are negative. Past Medical History:  Diagnosis Date   Asthma    Past Surgical History:  Procedure Laterality Date   BREAST LUMPECTOMY     WISDOM TOOTH EXTRACTION     Social History:  reports that she quit smoking about 4 years ago. Her smoking use included cigarettes. She has never used smokeless tobacco. She reports that she  does not currently use alcohol. She reports that she does not currently use drugs after having used the following drugs: Marijuana.  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 No pertinent family history.  Prior to Admission medications   Medication Sig Start Date End Date Taking? Authorizing Provider  albuterol  (PROVENTIL ) (2.5 MG/3ML) 0.083% nebulizer solution Take 3 mLs (2.5 mg total) by nebulization every 4 (four) hours as needed for wheezing or shortness of breath. 06/22/23 06/21/24 Yes Angelena Smalls, MD  albuterol  (VENTOLIN  HFA) 108 (90 Base) MCG/ACT inhaler Inhale 2 puffs into the lungs every 4 (four) hours as needed for wheezing or shortness of breath. 06/22/23  Yes Angelena Smalls, MD  azithromycin  (ZITHROMAX ) 250 MG tablet Take 250 mg by mouth once. Took 1 dose yesterday 04/16/24 04/21/24 Yes [provider]  cetirizine (ZYRTEC) 10 MG tablet Take 10 mg by mouth daily.   Yes [provider]  EPINEPHrine  0.3 mg/0.3 mL IJ SOAJ injection Inject 0.3 mg into the muscle as needed for anaphylaxis. 01/31/18  Yes [provider]  ipratropium-albuterol  (DUONEB) 0.5-2.5 (3) MG/3ML SOLN Take 3 mLs by nebulization every 6 (six) hours as needed. 04/15/24  Yes Corlis Burnard DEL, NP  lisdexamfetamine (VYVANSE) 40 MG capsule Take 40  mg by mouth in the morning. 03/18/24  Yes [provider]  mometasone -formoterol  (DULERA ) 200-5 MCG/ACT AERO Inhale 2 puffs into the lungs 2 (two) times daily. 06/22/23  Yes Angelena Smalls, MD  montelukast  (SINGULAIR ) 10 MG tablet Take 1 tablet (10 mg total) by mouth at bedtime. 09/16/20  Yes Drusilla Sabas RAMAN, MD  nicotine  (NICODERM CQ  - DOSED IN MG/24 HOURS) 14 mg/24hr patch Place 14 mg onto the skin daily. 12/29/23  Yes  [provider]  ondansetron  (ZOFRAN  ODT) 4 MG disintegrating tablet Take 1 tablet (4 mg total) by mouth every 8 (eight) hours as needed for nausea or vomiting. 06/30/18  Yes Emil Share, DO  predniSONE  (DELTASONE ) 10 MG tablet Take 4 tablets (40 mg total) by mouth daily for 5 days. 04/16/24 04/21/24 Yes Corlis Burnard DEL, NP  propranolol (INDERAL) 10 MG tablet Take 10 mg by mouth 2 (two) times daily. 12/29/23 12/28/24 Yes [provider]  valACYclovir (VALTREX) 500 MG tablet Take 500 mg by mouth daily. 12/05/17  Yes [provider]  fluticasone (FLONASE) 50 MCG/ACT nasal spray Place 1 spray into both nostrils 2 (two) times daily. Patient not taking: Reported on 04/17/2024 07/18/21   [provider]  nitrofurantoin, macrocrystal-monohydrate, (MACROBID) 100 MG capsule Take 100 mg by mouth 2 (two) times daily. Patient not taking: Reported on 04/17/2024 11/01/23   [provider]  norgestimate-ethinyl estradiol (ORTHO-CYCLEN) 0.25-35 MG-MCG tablet Take 1 tablet by mouth daily. 08/19/20   [provider]    Physical Exam: Vitals:   04/17/24 0840 04/17/24 0930 04/17/24 0945 04/17/24 1017  BP: (!) 153/104 (!) 144/95    Pulse: (!) 124 (!) 111 (!) 114   Resp: (!) 21 15 14    Temp:    98.2 F (36.8 C)  TempSrc:    Oral  SpO2: 94% 100% 100%   Weight:      Height:       Physical Exam Constitutional:      Appearance: She is normal weight.     Comments: Mild increased WOB   HENT:     Head: Normocephalic and atraumatic.     Nose: Nose normal.     Mouth/Throat:     Mouth: Mucous membranes are moist.  Eyes:     Pupils: Pupils are equal, round, and reactive to light.  Cardiovascular:     Rate and Rhythm: Normal rate.  Pulmonary:     Effort: Pulmonary effort is normal.     Breath sounds: Wheezing and rales present.  Abdominal:     General: Bowel sounds are normal.  Musculoskeletal:        General: Normal range of motion.  Skin:    General: Skin is  warm.  Neurological:     General: No focal deficit present.  Psychiatric:        Mood and Affect: Mood normal.     Data Reviewed:  There are no new results to review at this time.  DG Chest Portable 1 View EXAM: 1 VIEW(S) XRAY OF THE CHEST 04/17/2024 08:58:00 AM  COMPARISON: 06/22/2023  CLINICAL HISTORY: cough, hypoxia. Cough, hypoxia  FINDINGS:  LUNGS AND PLEURA: No focal pulmonary opacity. No pulmonary edema. No pleural effusion. No pneumothorax.  HEART AND MEDIASTINUM: No acute abnormality of the cardiac and mediastinal silhouettes.  BONES AND SOFT TISSUES: No acute osseous abnormality.  IMPRESSION: 1. No acute process.  Electronically signed by: Evalene Coho MD 04/17/2024 09:17 AM EDT RP Workstation: [id number]  Lab Results  Component Value Date  WBC 18.9 (H) 04/17/2024   HGB 12.9 04/17/2024   HCT 39.3 04/17/2024   MCV 91.8 04/17/2024   PLT 331 04/17/2024   Last metabolic panel Lab Results  Component Value Date   GLUCOSE 100 (H) 04/17/2024   NA 141 04/17/2024   K 3.8 04/17/2024   CL 111 04/17/2024   CO2 20 (L) 04/17/2024   BUN 21 (H) 04/17/2024   CREATININE 0.52 04/17/2024   GFRNONAA >60 04/17/2024   CALCIUM 8.9 04/17/2024   PROT 7.7 10/26/2021   ALBUMIN 4.5 10/26/2021   BILITOT 1.0 10/26/2021   ALKPHOS 55 10/26/2021   AST 13 (L) 10/26/2021   ALT 14 10/26/2021   ANIONGAP 10 04/17/2024    Assessment and Plan: Acute respiratory failure with hypoxia (HCC) Asthma Exacerbation  Decompensated respiratory status now requiring high flow nasal cannula in the setting of asthma exacerbation Failed outpatient management with IM Solu-Medrol  and DuoNebs Chest x-ray within normal limits COVID flu and RSV negative in the outpatient setting Will add on formal respiratory panel to assess Suspect sudden cold weather changes predominant etiology Vaping is a confounding issue IV Solu-Medrol  DuoNebs IV magnesium  Monitor respiratory status closely  in the stepdown bed for now Follow-up closely  Leukocytosis White count 18 on presentation Suspect likel secondary to excessive steroid use No overt infection noted Will otherwise monitor for now Trend with treatment Reassess as appropriate  Vaping nicotine  dependence, tobacco product Active vaping use  Discussed cessation  Nicotine  patch        Advance Care Planning:   Code Status: Prior   Consults: None   Family Communication: Family at the bedside   Severity of Illness: The appropriate patient status for this patient is OBSERVATION. Observation status is judged to be reasonable and necessary in order to provide the required intensity of service to ensure the patient's safety. The patient's presenting symptoms, physical exam findings, and initial radiographic and laboratory data in the context of their medical condition is felt to place them at decreased risk for further clinical deterioration. Furthermore, it is anticipated that the patient will be medically stable for discharge from the hospital within 2 midnights of admission.   Author: Elspeth JINNY Masters, MD 04/17/2024 11:57 AM  For on call review [email or web address].

## 2024-04-17 NOTE — Progress Notes (Signed)
 CT Chest concerning for multifocal PNA  Will add on IV Rocephin  and Azithromycin  for broadened resp coverage  Will add on blood and respiratory cultures  Follow closely

## 2024-04-17 NOTE — ED Triage Notes (Signed)
 Pt to ED for asthma exascerbation since yesterday after URI since Sunday. Pt labored, 78% on RA in triage and HR 150. Pt used duonebs, dulera  and albuterol  this morning. Chest and upper back are sore. Respirations are very labored, accessory muscle use noted, spurasternal retractions and pourse lip breathing noted. Called Dr Suzanne to bedside, called RT.  93% on 6L.

## 2024-04-17 NOTE — ED Provider Notes (Signed)
 [facility] Provider Note    Event Date/Time   First MD Initiated Contact with Patient 04/17/24 [phone]     (approximate)   History   Shortness of Breath   HPI  Angela Mckenzie is a 23 y.o. female past medical history significant for asthma who presents to the emergency department shortness of breath.  Patient states that she has not been feeling well for the past couple of days and has been to urgent care for the past 2 days.  Endorses cough, congestion, shortness of breath and wheezing.  Was given 40 mg of IM Solu-Medrol  yesterday and the day before and has been using DuoNeb treatments.  No history of DVT or PE.  Does take estrogen birth control.  Does have nicotine  use with vaping.  Denies any concern for pregnancy.  States that she has chest pain just from breathing hard for the past couple of days.  States that she tested negative for COVID, influenza and RSV.  No prior hospitalizations in the ICU for her asthma.  No prior need for intubation or BiPAP.     Physical Exam   Triage Vital Signs: ED Triage Vitals  Encounter Vitals Group     BP 04/17/24 0840 (!) 153/104     Girls Systolic BP Percentile --      Girls Diastolic BP Percentile --      Boys Systolic BP Percentile --      Boys Diastolic BP Percentile --      Pulse Rate 04/17/24 0833 (!) 150     Resp 04/17/24 0833 (!) 28     Temp --      Temp src --      SpO2 04/17/24 0833 (!) 78 %     Weight 04/17/24 0836 123 lb 9.6 oz (56.1 kg)     Height 04/17/24 0836 5' 2 (1.575 m)     Head Circumference --      Peak Flow --      Pain Score 04/17/24 0834 7     Pain Loc --      Pain Education --      Exclude from Growth Chart --     Most recent vital signs: Vitals:   04/17/24 0945 04/17/24 1017  BP:    Pulse: (!) 114   Resp: 14   Temp:  98.2 F (36.8 C)  SpO2: 100%     Physical Exam Constitutional:      General: She is in acute distress.     Appearance: She is well-developed. She is  ill-appearing.  HENT:     Head: Atraumatic.  Eyes:     Conjunctiva/sclera: Conjunctivae normal.  Cardiovascular:     Rate and Rhythm: Regular rhythm.  Pulmonary:     Effort: Respiratory distress present.     Breath sounds: Wheezing present.     Comments: 78% on room air, placed on nonrebreather and then high flow nasal cannula.  Poor air movement.  Diffuse inspiratory and expiratory wheezing throughout all lung fields.  Assessor muscle use.  Speaking in one-word sentences. Abdominal:     General: There is no distension.     Palpations: Abdomen is soft.     Tenderness: There is no abdominal tenderness.  Musculoskeletal:        General: Normal range of motion.     Cervical back: Normal range of motion.     Right lower leg: No edema.     Left lower leg: No edema.  Comments: No unilateral leg swelling  Skin:    General: Skin is warm.     Capillary Refill: Capillary refill takes less than 2 seconds.  Neurological:     General: No focal deficit present.     Mental Status: She is alert. Mental status is at baseline.  Psychiatric:        Mood and Affect: Mood normal.     IMPRESSION / MDM / ASSESSMENT AND PLAN / ED COURSE  I reviewed the triage vital signs and the nursing notes.  Differential diagnosis including asthma exacerbation, pneumonia, pulmonary embolism, anemia, COVID/influenza  EKG  I, Clotilda Punter, the attending physician, personally viewed and interpreted this ECG.  Sinus tachycardia with a heart rate of 130, QTc 472.  Nonspecific ST changes.  Narrow complex.  Sinus tachycardia while on cardiac telemetry.  RADIOLOGY I independently reviewed imaging, my interpretation of imaging: Chest x-ray with no signs of pneumonia  LABS (all labs ordered are listed, but only abnormal results are displayed) Labs interpreted as -    Labs Reviewed  CBC WITH DIFFERENTIAL/PLATELET - Abnormal; Notable for the following components:      Result Value   WBC 18.9 (*)    Neutro  Abs 15.4 (*)    Abs Immature Granulocytes 0.12 (*)    All other components within normal limits  BASIC METABOLIC PANEL WITH GFR - Abnormal; Notable for the following components:   CO2 20 (*)    Glucose, Bld 100 (*)    BUN 21 (*)    All other components within normal limits  D-DIMER, QUANTITATIVE  POC URINE PREG, ED     MDM  Patient with leukocytosis of 18.9.  Does have tachycardia and tachypnea.  No O2 requirement and was 78% on room air.    Placed on high flow nasal cannula at 35 L a minute.  Given continuous DuoNebs, IV Solu-Medrol .  On reevaluation continued to have diffuse wheezing and given a continuous albuterol  nebulizer.  Low risk Wells criteria, D-dimer was negative have a low suspicion for pulmonary embolism.  No focal findings consistent with pneumonia.  Most likely with asthma exacerbation in the setting of viral illness.  Started on doxycycline  for possible atypical pneumonia.  Will consult hospitalist for admission for severe asthma exacerbation with acute hypoxia.     PROCEDURES:  Critical Care performed: yes  .Critical Care  Performed by: Punter Clotilda, MD Authorized by: Punter Clotilda, MD   Critical care provider statement:    Critical care time (minutes):  40   Critical care time was exclusive of:  Separately billable procedures and treating other patients   Critical care was necessary to treat or prevent imminent or life-threatening deterioration of the following conditions:  Respiratory failure   Critical care was time spent personally by me on the following activities:  Development of treatment plan with patient or surrogate, discussions with consultants, evaluation of patient's response to treatment, examination of patient, ordering and review of laboratory studies, ordering and review of radiographic studies, ordering and performing treatments and interventions, pulse oximetry, re-evaluation of patient's condition and review of old charts   Care discussed  with: admitting provider     Patient's presentation is most consistent with acute presentation with potential threat to life or bodily function.   MEDICATIONS ORDERED IN ED: Medications  albuterol  (PROVENTIL ) (2.5 MG/3ML) 0.083% nebulizer solution 20 mg (20 mg Nebulization New Bag/Given 04/17/24 0931)  methylPREDNISolone  sodium succinate (SOLU-MEDROL ) 125 mg/2 mL injection 125 mg (125 mg Intravenous Given  04/17/24 0847)  ipratropium-albuterol  (DUONEB) 0.5-2.5 (3) MG/3ML nebulizer solution 9 mL (9 mLs Nebulization Given 04/17/24 0847)  sodium chloride  0.9 % bolus 1,000 mL (0 mLs Intravenous Stopped 04/17/24 1022)  ketorolac  (TORADOL ) 15 MG/ML injection 15 mg (15 mg Intravenous Given 04/17/24 1007)  doxycycline  (VIBRA -TABS) tablet 100 mg (100 mg Oral Given 04/17/24 1021)  lactated ringers  bolus 1,000 mL (1,000 mLs Intravenous New Bag/Given 04/17/24 1022)    FINAL CLINICAL IMPRESSION(S) / ED DIAGNOSES   Final diagnoses:  Hypoxia  Severe persistent asthma with exacerbation (HCC)     Rx / DC Orders   ED Discharge Orders     None        Note:  This document was prepared using Dragon voice recognition software and may include unintentional dictation errors.   Suzanne Kirsch, MD 04/17/24 1022

## 2024-04-17 NOTE — ED Notes (Signed)
 NURSE BRANDON INFORMED OF BED ASSIGNED

## 2024-04-17 NOTE — Assessment & Plan Note (Signed)
 Active vaping use  Discussed cessation  Nicotine  patch

## 2024-04-17 NOTE — Assessment & Plan Note (Addendum)
 Asthma Exacerbation  Decompensated respiratory status now requiring high flow nasal cannula in the setting of asthma exacerbation Failed outpatient management with IM Solu-Medrol  and DuoNebs Chest x-ray within normal limits COVID flu and RSV negative in the outpatient setting Will add on formal respiratory panel to assess Suspect sudden cold weather changes predominant etiology Vaping is a confounding issue IV Solu-Medrol  DuoNebs IV magnesium  Monitor respiratory status closely in the stepdown bed for now Follow-up closely

## 2024-04-18 DIAGNOSIS — J4531 Mild persistent asthma with (acute) exacerbation: Secondary | ICD-10-CM

## 2024-04-18 LAB — CBC
HCT: 34.6 % — ABNORMAL LOW (ref 36.0–46.0)
Hemoglobin: 11.2 g/dL — ABNORMAL LOW (ref 12.0–15.0)
MCH: 30 pg (ref 26.0–34.0)
MCHC: 32.4 g/dL (ref 30.0–36.0)
MCV: 92.8 fL (ref 80.0–100.0)
Platelets: 292 K/uL (ref 150–400)
RBC: 3.73 MIL/uL — ABNORMAL LOW (ref 3.87–5.11)
RDW: 13 % (ref 11.5–15.5)
WBC: 18.1 K/uL — ABNORMAL HIGH (ref 4.0–10.5)
nRBC: 0 % (ref 0.0–0.2)

## 2024-04-18 LAB — EXPECTORATED SPUTUM ASSESSMENT W GRAM STAIN, RFLX TO RESP C

## 2024-04-18 LAB — RESPIRATORY PANEL BY PCR

## 2024-04-18 LAB — COMPREHENSIVE METABOLIC PANEL WITH GFR
ALT: 17 U/L (ref 0–44)
AST: 14 U/L — ABNORMAL LOW (ref 15–41)
Albumin: 3.2 g/dL — ABNORMAL LOW (ref 3.5–5.0)
Alkaline Phosphatase: 35 U/L — ABNORMAL LOW (ref 38–126)
Anion gap: 9 (ref 5–15)
BUN: 15 mg/dL (ref 6–20)
CO2: 22 mmol/L (ref 22–32)
Calcium: 8.5 mg/dL — ABNORMAL LOW (ref 8.9–10.3)
Chloride: 109 mmol/L (ref 98–111)
Creatinine, Ser: 0.37 mg/dL — ABNORMAL LOW (ref 0.44–1.00)
GFR, Estimated: >60 mL/min (ref >60)
Glucose, Bld: 111 mg/dL — ABNORMAL HIGH (ref 70–99)
Potassium: 4.3 mmol/L (ref 3.5–5.1)
Sodium: 140 mmol/L (ref 135–145)
Total Bilirubin: 0.3 mg/dL (ref 0.0–1.2)
Total Protein: 6.5 g/dL (ref 6.5–8.1)

## 2024-04-18 LAB — STREP PNEUMONIAE URINARY ANTIGEN: Strep Pneumo Urinary Antigen: NEGATIVE

## 2024-04-18 MED ORDER — AZITHROMYCIN 250 MG PO TABS
500.0000 mg | ORAL_TABLET | Freq: Every day | ORAL | Status: DC
  Administered 2024-04-18 – 2024-04-19 (×2): 500 mg via ORAL
  Filled 2024-04-18 (×2): qty 2

## 2024-04-18 MED ORDER — NICOTINE 14 MG/24HR TD PT24
14.0000 mg | MEDICATED_PATCH | Freq: Every day | TRANSDERMAL | Status: DC
  Administered 2024-04-18 – 2024-04-19 (×2): 14 mg via TRANSDERMAL
  Filled 2024-04-18 (×2): qty 1

## 2024-04-18 MED ORDER — IPRATROPIUM-ALBUTEROL 0.5-2.5 (3) MG/3ML IN SOLN
3.0000 mL | RESPIRATORY_TRACT | Status: DC
  Administered 2024-04-18 – 2024-04-19 (×4): 3 mL via RESPIRATORY_TRACT
  Filled 2024-04-18 (×4): qty 3

## 2024-04-18 MED ORDER — ENSURE PLUS HIGH PROTEIN PO LIQD
237.0000 mL | Freq: Two times a day (BID) | ORAL | Status: DC
  Administered 2024-04-18 – 2024-04-19 (×2): 237 mL via ORAL

## 2024-04-18 MED ORDER — ADULT MULTIVITAMIN W/MINERALS CH
1.0000 | ORAL_TABLET | Freq: Every day | ORAL | Status: DC
  Administered 2024-04-18 – 2024-04-19 (×2): 1 via ORAL
  Filled 2024-04-18 (×2): qty 1

## 2024-04-18 NOTE — Plan of Care (Signed)
  Problem: Clinical Measurements: Goal: Ability to maintain clinical measurements within normal limits will improve Outcome: Progressing   Problem: Clinical Measurements: Goal: Respiratory complications will improve Outcome: Progressing   Problem: Activity: Goal: Risk for activity intolerance will decrease Outcome: Progressing   Problem: Pain Managment: Goal: General experience of comfort will improve and/or be controlled Outcome: Progressing   Problem: Safety: Goal: Ability to remain free from injury will improve Outcome: Progressing   Problem: Activity: Goal: Ability to tolerate increased activity will improve Outcome: Progressing

## 2024-04-18 NOTE — Plan of Care (Signed)

## 2024-04-18 NOTE — Evaluation (Signed)
 Occupational Therapy Evaluation Patient Details Name: Angela Mckenzie MRN: [id number] DOB: 26-Aug-2000 Today's Date: 04/18/2024   History of Present Illness   Pt is a 23 y.o. female presenting with acute respiratory failure with hypoxia, leukocytosis and asthma exacerbation, as well as URI symptoms. Current MD assessment: acute respiratory failure with hypoxia and asthma exacerbation. PMH of asthma, vaping.     Clinical Impressions Pt was seen for OT evaluation this date. PTA, pt lives in a 3rd level apartment. She is IND with all tasks and works as a psychologist, sport and exercise at [employer]. She denies use of 02 at baseline, but has had asthma exacerbations in the past. She reports mild weakness/shakiness from being on steroids and SOB with activity. Found on 1L with sp02 at 90%. Placed on RA for session and demo IND with all tasks. Pt with sp02 of 92% and HR of 126 after ~200 ft ambulation without rest break. Pt with very mild SOB and cough, but sp02 stable on RA between 91-94% and HR recovered to 103 at rest. Edu pt on ECS, PLB, pacing, and task modification/simplification to maximize her IND/safety and prevent overexertion during ADLs/IADLs. Boyfriend will assist with grocery shopping and higher level IADLs as she recovers. Pt does not require acute skilled OT services with no follow up therapy recommended.     If plan is discharge home, recommend the following:         Functional Status Assessment   Patient has not had a recent decline in their functional status     Equipment Recommendations   None recommended by OT     Recommendations for Other Services         Precautions/Restrictions   Precautions Precautions: None Recall of Precautions/Restrictions: Intact Restrictions Weight Bearing Restrictions Per Provider Order: No     Mobility Bed Mobility Overal bed mobility: Independent                  Transfers Overall transfer level: Independent                         Balance Overall balance assessment: Independent                                         ADL either performed or assessed with clinical judgement   ADL Overall ADL's : Independent                                             Vision         Perception         Praxis         Pertinent Vitals/Pain Pain Assessment Pain Assessment: No/denies pain     Extremity/Trunk Assessment Upper Extremity Assessment Upper Extremity Assessment: Overall WFL for tasks assessed   Lower Extremity Assessment Lower Extremity Assessment: Overall WFL for tasks assessed;Generalized weakness (reports mild weakness/shakiness from steroids)       Communication Communication Communication: No apparent difficulties   Cognition Arousal: Alert Behavior During Therapy: WFL for tasks assessed/performed Cognition: No apparent impairments                               Following commands: Intact  Cueing  General Comments      sp02 on 1-1.5L on entry at rest 90%, placed on RA for ambulation ~200 ft with sp02 92% after ambulation and HR up to 126; left on RA with spo2 ranging from 91-94%   Exercises Other Exercises Other Exercises: Edu on role of OT in acute setting, PLB techniques, ECS, task modification/simplification to maximize safety/IND and prevent overexertion. Pt verbalized understanding.   Shoulder Instructions      Home Living Family/patient expects to be discharged to:: Private residence Living Arrangements: Spouse/significant other (boyfriend) Available Help at Discharge: Family;Available PRN/intermittently Type of Home: Apartment Home Access: Stairs to enter Entrance Stairs-Number of Steps: 3rd level, has 3 flights of stairs to go up/down   Home Layout: One level                          Prior Functioning/Environment Prior Level of Function : Independent/Modified Independent;Working/employed              Mobility Comments: IND ADLs Comments: works as a psychologist, sport and exercise here    OT Problem List:     OT Treatment/Interventions:        OT Goals(Current goals can be found in the care plan section)       OT Frequency:       Co-evaluation              AM-PAC OT 6 Clicks Daily Activity     Outcome Measure Help from another person eating meals?: None Help from another person taking care of personal grooming?: None Help from another person toileting, which includes using toliet, bedpan, or urinal?: None Help from another person bathing (including washing, rinsing, drying)?: None Help from another person to put on and taking off regular upper body clothing?: None Help from another person to put on and taking off regular lower body clothing?: None 6 Click Score: 24   End of Session Nurse Communication: Mobility status  Activity Tolerance: Patient tolerated treatment well Patient left: in bed;with call bell/phone within reach  OT Visit Diagnosis: Other abnormalities of gait and mobility (R26.89)                Time: 9194-9186 OT Time Calculation (min): 8 min Charges:  OT General Charges $OT Visit: 1 Visit OT Evaluation $OT Eval Low Complexity: 1 Low Debie Ashline, OTR/L 04/18/24, 8:23 AM  Brittony Billick E Gizelle Whetsel 04/18/2024, 8:20 AM

## 2024-04-18 NOTE — TOC CM/SW Note (Signed)
 Transition of Care [facility]) - Inpatient Brief Assessment   Patient Details  Name: Angela Mckenzie MRN: [id number] Date of Birth: 2000/07/11  [facility] CM/SW Contact:    Lauraine JAYSON Carpen, LCSW Phone Number: 04/18/2024, 10:15 AM   Clinical Narrative: CSW reviewed chart. SDOH flags for housing and utilities. Resources added to AVS. Patient is currently on acute oxygen. Will follow for this potential discharge need. CSW acknowledged consult for home health/DME needs. PT and OT have determined there are no needs. CSW will continue to follow progress.  Transition of Care Asessment: Insurance and Status: Insurance coverage has been reviewed Patient has primary care physician: Yes Home environment has been reviewed: Single family home Prior level of function:: Independent Prior/Current Home Services: No current home services Social Drivers of Health Review: SDOH reviewed interventions complete Readmission risk has been reviewed: Yes Transition of care needs: transition of care needs identified, TOC will continue to follow

## 2024-04-18 NOTE — Progress Notes (Addendum)
 Progress Note    Angela Mckenzie  FMW:[id number] DOB: 03/29/2001  DOA: 04/17/2024 PCP: Harvey Gaetana CROME, NP      Brief Narrative:    Medical records reviewed and are as summarized below:  Angela Mckenzie is a 23 y.o. 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  Initially developed cough, rhinorrhea, facial pressure and postnasal drip on Sunday, 04/14/2024.  She went to the urgent care clinic on 04/15/2024 where she was treated with IM Solu-Medrol  and DuoNeb via nebulizer.  She went to see her PCP on 04/17/2024 and she was given IM steroids and was given a prescription for prednisone  and Z-Pak which she has not started yet.  She noticed worsening cough, shortness of breath, wheezing and pleuritic chest pain so she presented to the ED.  Vital signs in the ED respirate rate 28, pulse 150, oxygen saturation 78% on room air, temperature 98.2 F.  Chest x-ray was unremarkable  CT chest 1. Patchy bilateral airspace opacification mostly peripheral in location. Findings are likely due to multifocal pneumonia which may be of viral or bacterial etiology. Recommend follow-up chest CT 4-6 weeks after appropriate treatment to document resolution.   2. Predominant linear but somewhat nodular density along the upper right paramediastinal region anteriorly extending to the pleural likely atelectasis or part of the presumed infectious process as described above. Recommend attention to this area on the above described 4-6 week follow-up CT.    She was admitted to the hospital for multifocal pneumonia, asthma exacerbation and acute hypoxic respiratory failure.  Assessment/Plan:   Principal Problem:   Asthma exacerbation Active Problems:   Acute respiratory failure with hypoxia (HCC)    Leukocytosis   Vaping nicotine  dependence, tobacco product   Body mass index is 22.61 kg/m.    Multifocal pneumonia: Continue empiric IV ceftriaxone  and azithromycin . RSV, influenza A and B and SARS-CoV-2 test were negative. Respiratory viral panel was negative.   Acute asthma exacerbation: Continue IV steroids and bronchodilators. She admits that she does not use Dulera  on a regular basis at home.  She has been advised to use Dulera  as prescribed twice a day as maintenance therapy and use albuterol  as needed as a rescue inhaler.  Acute hypoxic respiratory failure: Improved.  She has been weaned off of room air.   Sinus tachycardia: This is likely due to acute illness and expected to improve. Leukocytosis: Recent steroids probably contributing to leukocytosis.   Regular vaping: Patient has been advised to avoid vaping since this may cause vaping-associated lung injury.   Comorbidities include seasonal allergies, allergic rhinitis, allergic conjunctivitis  Diet Order             Diet regular Room service appropriate? Yes; Fluid consistency: Thin  Diet effective now                                  Consultants: None  Procedures: None    Medications:    enoxaparin  (LOVENOX ) injection  40 mg Subcutaneous Q24H   levalbuterol  0.63 mg Nebulization Q4H   nicotine   7 mg Transdermal Daily   [START ON 04/19/2024] predniSONE   40 mg Oral Q breakfast   Continuous Infusions:  azithromycin  Stopped (04/17/24 2103)   cefTRIAXone  (ROCEPHIN )  IV 2 g (04/17/24 2116)  Anti-infectives (From admission, onward)    Start     Dose/Rate Route Frequency Ordered Stop   04/17/24 1730  cefTRIAXone  (ROCEPHIN ) 2 g in sodium chloride  0.9 % 100 mL IVPB        2 g 200 mL/hr over 30 Minutes Intravenous Every 24 hours 04/17/24 1638 04/22/24 1729   04/17/24 1730  azithromycin  (ZITHROMAX ) 500 mg in sodium chloride  0.9 % 250 mL IVPB        500 mg 250 mL/hr over 60  Minutes Intravenous Every 24 hours 04/17/24 1638 04/22/24 1729   04/17/24 1030  doxycycline  (VIBRA -TABS) tablet 100 mg        100 mg Oral  Once 04/17/24 1018 04/17/24 1021              Family Communication/Anticipated D/C date and plan/Code Status   DVT prophylaxis: enoxaparin  (LOVENOX ) injection 40 mg Start: 04/18/24 1000     Code Status: Full Code  Family Communication: None Disposition Plan: Plan to discharge home   Status is: Inpatient Remains inpatient appropriate because: Multifocal pneumonia and asthma dissipation with respiratory failure       Subjective:   Interval events noted.  She complains of cough, wheezing and pleuritic chest pain.  She is short of breath but breathing is better than yesterday.  Objective:    Vitals:   04/18/24 0343 04/18/24 0417 04/18/24 0728 04/18/24 0845  BP: 118/76   130/75  Pulse: 96   86  Resp: 20     Temp: 98.3 F (36.8 C)   98.3 F (36.8 C)  TempSrc:      SpO2: 95% 95% 94% 93%  Weight:      Height:       No data found.   Intake/Output Summary (Last 24 hours) at 04/18/2024 0929 Last data filed at 04/18/2024 0506 Gross per 24 hour  Intake 590.07 ml  Output --  Net 590.07 ml   Filed Weights   04/17/24 0836  Weight: 56.1 kg    Exam:  GEN: NAD SKIN:Warm and dry EYES: No pallor or icterus ENT: MMM CV: RRR, tachycardic PULM:Decreased air entry bilaterally,  B/l wheezing ABD: soft, ND, NT, +BS CNS: AAO x 3, non focal EXT: No edema or tenderness        Data Reviewed:   I have personally reviewed following labs and imaging studies:  Labs: Labs show the following:   Basic Metabolic Panel: Recent Labs  Lab 04/17/24 0843 04/18/24 0421  NA 141 140  K 3.8 4.3  CL 111 109  CO2 20* 22  GLUCOSE 100* 111*  BUN 21* 15  CREATININE 0.52 0.37*  CALCIUM 8.9 8.5*   GFR Estimated Creatinine Clearance: 85 mL/min (A) (by C-G formula based on SCr of 0.37 mg/dL (L)). Liver Function Tests: Recent  Labs  Lab 04/18/24 0421  AST 14*  ALT 17  ALKPHOS 35*  BILITOT 0.3  PROT 6.5  ALBUMIN 3.2*   No results for input(s): LIPASE, AMYLASE in the last 168 hours. No results for input(s): AMMONIA in the last 168 hours. Coagulation profile No results for input(s): INR, PROTIME in the last 168 hours.  CBC: Recent Labs  Lab 04/17/24 0843 04/18/24 0421  WBC 18.9* 18.1*  NEUTROABS 15.4*  --   HGB 12.9 11.2*  HCT 39.3 34.6*  MCV 91.8 92.8  PLT 331 292   Cardiac Enzymes: No results for input(s): CKTOTAL, CKMB, CKMBINDEX, TROPONINI in the last 168 hours. BNP (last 3 results) No results for input(s): PROBNP  in the last 8760 hours. CBG: No results for input(s): GLUCAP in the last 168 hours. D-Dimer: Recent Labs    04/17/24 0844  DDIMER <0.27   Hgb A1c: No results for input(s): HGBA1C in the last 72 hours. Lipid Profile: No results for input(s): CHOL, HDL, LDLCALC, TRIG, CHOLHDL, LDLDIRECT in the last 72 hours. Thyroid function studies: No results for input(s): TSH, T4TOTAL, T3FREE, THYROIDAB in the last 72 hours.  Invalid input(s): FREET3 Anemia work up: No results for input(s): VITAMINB12, FOLATE, FERRITIN, TIBC, IRON, RETICCTPCT in the last 72 hours. Sepsis Labs: Recent Labs  Lab 04/17/24 0843 04/18/24 0421  WBC 18.9* 18.1*    Microbiology Recent Results (from the past 240 hours)  Culture, blood (Routine X 2) w Reflex to ID Panel     Status: None (Preliminary result)   Collection Time: 04/17/24  6:08 PM   Specimen: BLOOD  Result Value Ref Range Status   Specimen Description BLOOD RIGHT ANTECUBITAL  Final   Special Requests   Final    BOTTLES DRAWN AEROBIC AND ANAEROBIC Blood Culture adequate volume   Culture   Final    NO GROWTH < 12 HOURS Performed at [facility], [address], [place], [place] [address]    Report Status PENDING  Incomplete  Culture, blood (Routine X 2) w Reflex to ID  Panel     Status: None (Preliminary result)   Collection Time: 04/17/24  6:09 PM   Specimen: BLOOD  Result Value Ref Range Status   Specimen Description BLOOD BLOOD LEFT HAND  Final   Special Requests   Final    BOTTLES DRAWN AEROBIC AND ANAEROBIC Blood Culture adequate volume   Culture   Final    NO GROWTH < 12 HOURS Performed at [facility], [address], [place], [place] [address]    Report Status PENDING  Incomplete  Expectorated Sputum Assessment w Gram Stain, Rflx to Resp Cult     Status: None   Collection Time: 04/18/24  4:50 AM   Specimen: Expectorated Sputum  Result Value Ref Range Status   Specimen Description EXPECTORATED SPUTUM  Final   Special Requests NONE  Final   Sputum evaluation   Final    THIS SPECIMEN IS ACCEPTABLE FOR SPUTUM CULTURE Performed at [facility], [address], [place], [place] [address]    Report Status 04/18/2024 FINAL  Final    Procedures and diagnostic studies:  CT CHEST WO CONTRAST Result Date: 04/17/2024 CLINICAL DATA:  Asthma exacerbation since yesterday with upper respiratory infection 3 days. Sore chest and upper back. Difficulty breathing. EXAM: CT CHEST WITHOUT CONTRAST TECHNIQUE: Multidetector CT imaging of the chest was performed following the standard protocol without IV contrast. RADIATION DOSE REDUCTION: This exam was performed according to the departmental dose-optimization program which includes automated exposure control, adjustment of the mA and/or kV according to patient size and/or use of iterative reconstruction technique. COMPARISON:  CT abdomen 10/26/2021 FINDINGS: Cardiovascular: Heart is normal size. Thoracic aorta is normal in caliber. Pulmonary arterial system and remaining vascular structures are unremarkable on this noncontrast exam. Mediastinum/Nodes: No mediastinal or hilar adenopathy. Remaining mediastinal structures are unremarkable. Lungs/Pleura: Lungs are adequately inflated demonstrate patchy  bilateral airspace opacification which are mostly peripheral/subpleural location and ground-glass to moderate in density. These changes are more prominent over the mid to upper lungs linear but somewhat nodular area of opacification extending vertically along the anterior right paramediastinal region to the pleura likely atelectasis or part of the more diffuse  patchy airspace process described previously. No effusion. Airways are normal. Upper Abdomen: No acute findings over the visualized upper abdominal structures. Musculoskeletal: No focal abnormality. IMPRESSION: 1. Patchy bilateral airspace opacification mostly peripheral in location. Findings are likely due to multifocal pneumonia which may be of viral or bacterial etiology. Recommend follow-up chest CT 4-6 weeks after appropriate treatment to document resolution. 2. Predominant linear but somewhat nodular density along the upper right paramediastinal region anteriorly extending to the pleural likely atelectasis or part of the presumed infectious process as described above. Recommend attention to this area on the above described 4-6 week follow-up CT. Electronically Signed   By: Toribio Agreste M.D.   On: 04/17/2024 12:30   DG Chest Portable 1 View Result Date: 04/17/2024 EXAM: 1 VIEW(S) XRAY OF THE CHEST 04/17/2024 08:58:00 AM COMPARISON: 06/22/2023 CLINICAL HISTORY: cough, hypoxia. Cough, hypoxia FINDINGS: LUNGS AND PLEURA: No focal pulmonary opacity. No pulmonary edema. No pleural effusion. No pneumothorax. HEART AND MEDIASTINUM: No acute abnormality of the cardiac and mediastinal silhouettes. BONES AND SOFT TISSUES: No acute osseous abnormality. IMPRESSION: 1. No acute process. Electronically signed by: Evalene Coho MD 04/17/2024 09:17 AM EDT RP Workstation: [id number]               LOS: 1 day   Annaelle Kasel  [facility]   Pager on [email or web address]. If 7PM-7AM, please contact night-coverage at [email or web address]     04/18/2024,  9:29 AM

## 2024-04-18 NOTE — Discharge Instructions (Signed)
Rent/Utility/Housing  Agency Name: [facility] Address: [address] [place], [place] [address] Phone: [phone] Email: [email or web address] Website: [email or web address] Service(s) Offered: Housing services, self-sufficiency, congregate meal program, weatherization program, Field seismologist program, emergency food assistance,  housing counseling, home ownership program, wheels -towork program.  Agency Name: [facility] Address: [address], [place], [place] [address] Phone: [phone] (8a-4p) [phone] (8p- 10p) Email: [email or web address] Website: [email or web address] Service(s) Offered: A program for homeless and/or needy men that includes one-on-one counseling, life skills training and job rehabilitation.  Agency Name: [facility] Address: [address], [place], [place] [address] Phone: [phone] Website: www.[email or web address].org Service(s) Offered: Assistance to needy in emergency with utility bills, heating fuel, and prescriptions. Shelter for homeless 7pm-7am. October 13, 2016 15  Agency Name: [facility] [place] [facility] Address: [address], [place], [place] [address] Phone: [phone] Contact Person: Cathleen Corti Email: [email or web address] Website: [email or web address] Service(s) Offered: Helps individuals with developmental disabilities move from housing that is more restrictive to homes where they  can achieve greater independence and have more  opportunities.  Agency Name: [facility] Address: [address], [place], [place] [address] Phone: [phone] Email: [email or web address] Website: [email or web address].org Service(s) Offered: Provides affordable housing for low-income families, elderly, and disabled individuals. Offer a wide range of  programs and services, from financial planning to  afterschool and summer programs.  Agency Name: [facility] Address: [address] [place], [place] [address] Phone: [phone] Service(s) Offered: Child support services; child welfare services; food stamps; Medicaid; work first family assistance; and aid with fuel,  rent, food and medicine.  Agency Name: [facility] [place] [employer]. Address: [facility] [address], [place], [place]  [address] Phone: [phone] Website: www.familyabuseservices.org Service(s) Offered: 24 hour Crisis Line: [phone]; 24 hour Emergency Shelter; Transitional Housing; Support Groups; Scientist, physiological; [employer]; Hispanic Outreach: [phone];  Visitation Center: [phone].  Agency Name: [facility] [place]. Address: [address] [place], [place] [address] Phone: [phone] Service(s) Offered: CAP Services; Home and [employer]; Individual or Group Supports; Respite Care Non-Institutional Nursing;  Residential Supports; Respite Care and Personal Care Services; Transportation; Family and Friends Night; Recreational Activities; Three Nutritious Meals/Snacks; Consultation with Registered Dietician; Twenty-four hour Registered Nurse Access; Daily and [employer]; Camp Green Leaves; [place] for the [employer] (During Summer Months) Bingo Night (Every  Wednesday Night); Special Populations Dance Night  (Every Tuesday Night); Professional Hair Care Services.  Agency Name: [facility] Address: [address], [place], [place] [address] Phone: [phone] Contact Person: Jabier Mutton Website: [email or web address].Physicist, medical) Offered: Residential treatment facility for women; food and  clothing, educational & employment development and  transportation to work; Counsellor of financial skills;  parenting and family reunification; emotional and spiritual  support;  transitional housing for program graduates.  Agency Name: [facility] Address: [address] [place], [place] [address] Phone: [phone] Email: [email or web address] Website: [email or web address].es Service(s) Offered: Public housing units for elderly, disabled, and low income people; housing choice vouchers for income eligible  applicants; shelter plus care vouchers; and Psychologist, clinical.  Agency Name: [facility] [employer] Address: [address], [place], [place] [address] Phone: [phone] Email: [email or web address] Website: www.habitatalamance.org Service(s) Offered: Build houses for families in need of decent housing. Each adult in the family must invest 200 hours of labor on  someone else's house, work with volunteers to build their own house, attend classes  on budgeting, home maintenance, yard care, and attend homeowner association meetings.  Agency Name: [facility] Address: [address] [place], [place] [address] Phone: [phone] Website: www.rsli.org Service(s) Offered: Intermediate care facilities for intellectually delayed, Supervised Living in group homes for adults with developmental disabilities, Supervised Living for people who have dual diagnoses (MRMI), Independent Living, Supported Living, respite and a variety of CAP services, pre-vocational services, day supports, and [employer].  Agency Name: [place] [employer] Phone: [phone] Website: www.NCForeclosurePrevention.gov Service(s) Offered: Zero-interest, deferred loans to homeowners struggling to pay their mortgage. Call for more information.    Agency Name: [facility] Address: [address] [place], [place] [address] Phone: [phone] Website: www.alamanceservices.org Service(s) Offered: Housing services, self-sufficiency, congregate meal program, and individual development account  program.  Agency Name: [facility] Address: [address] [place], [place] [address] Phone: [phone] Email: [email or web address] Website: www.alliedchurches.org Service(s) Offered: Housing the homeless, feeding the hungry, Company secretary, job and education related services.  Agency Name: [facility] Address: [address], [place], [place] [address] Phone: [phone] Email: [email or web address] Service(s) Offered: Counseling, problem pregnancy, advocacy for Hispanics, limited emergency financial assistance.  Agency Name: [facility] Address: [address] [place], [place] [address] Phone: [phone] Website: [email or web address] Service(s) Offered: Child support services; child welfare services; SNAP; Medicaid; work first family assistance; and aid with fuel,  rent, food and medicine.  Agency Name: [facility] Address: [address], [place], [place] [address] Phone: [phone] or [phone] Email: [email or web address] Service(s) Offered: Family services and transient assistance; emergency food, fuel, clothing, limited furniture, utilities; budget counseling, general counseling; give a kid a coat; thrift store; Christmas food and toys. Utility assistance, food pantry, rental  assistance, life sustaining medicine

## 2024-04-18 NOTE — Progress Notes (Signed)
 PT Cancellation Note  Patient Details Name: Angela Mckenzie MRN: [id number] DOB: 2001-02-13   Cancelled Treatment:    Reason Eval/Treat Not Completed: PT screened, no needs identified, will sign off. Per OT via secure chat, no PT needs identified. Pt able to amb around nurse station on RA. Please re-consult if needs change.   Naliyah Neth, SPT  [name] 04/18/2024, 8:17 AM

## 2024-04-18 NOTE — Progress Notes (Signed)
 PHARMACIST - PHYSICIAN COMMUNICATION DR:   Jens CONCERNING: Antibiotic IV to Oral Route Change Policy  RECOMMENDATION: This patient is receiving Azithromycin  by the intravenous route.  Based on criteria approved by the Pharmacy and Therapeutics Committee, the antibiotic(s) is/are being converted to the equivalent oral dose form(s).   DESCRIPTION: These criteria include: Patient being treated for a respiratory tract infection, urinary tract infection, cellulitis or clostridium difficile associated diarrhea if on metronidazole  The patient is not neutropenic and does not exhibit a GI malabsorption state The patient is eating (either orally or via tube) and/or has been taking other orally administered medications for a least 24 hours The patient is improving clinically and has a Tmax < 100.5  Kaulin Chaves Rodriguez-Guzman PharmD, BCPS 04/18/2024 1:38 PM

## 2024-04-18 NOTE — Progress Notes (Signed)
 Initial Nutrition Assessment  DOCUMENTATION CODES:   Not applicable  INTERVENTION:   -Continue regular diet -MVI with minerals daily -Ensure Plus High Protein po BID, each supplement provides 350 kcal and 20 grams of protein  -RD referred patient to [facility] for further support and reinforcement secondary to ARFID  NUTRITION DIAGNOSIS:   Increased nutrient needs related to acute illness as evidenced by estimated needs.  GOAL:   Patient will meet greater than or equal to 90% of their needs  MONITOR:   PO intake, Supplement acceptance  REASON FOR ASSESSMENT:   Consult Assessment of nutrition requirement/status  ASSESSMENT:   23 y.o. female with medical history significant of asthma, vaping presenting with acute respiratory failure with hypoxia leukocytosis and asthma exacerbation.  Patient admitted with acute asthma exacerbation.   Reviewed I/O's: +590 ml x 24 hours  Patient sleeping soundly at time of visit.She did not arouse to voice. No family present at time of visit.   Noted weight has been stable over the past 11 months, however, noted distant history of weight loss. Per PCP notes (CareEverywhere visit on 12/29/23), patient has a history of ARFID and has had history of weight loss secondary to erratic eating patterns. She does not consume a lot of vegetables or fruit in her diet and typically consumes meat and potatoes. She is currently in nursing school and does not follow a consistent eating pattern secondary to rigorous schedule.   Medications reviewed and include prednisone .   Labs reviewed.    NUTRITION - FOCUSED PHYSICAL EXAM:  Flowsheet Row Most Recent Value  Orbital Region No depletion  Upper Arm Region No depletion  Thoracic and Lumbar Region No depletion  Buccal Region No depletion  Temple Region No depletion  Clavicle Bone Region No depletion  Clavicle and Acromion Bone Region No depletion  Scapular  Bone Region No depletion  Dorsal Hand No depletion  Patellar Region No depletion  Anterior Thigh Region No depletion  Posterior Calf Region No depletion  Edema (RD Assessment) None  Hair Reviewed  Eyes Reviewed  Mouth Reviewed  Skin Reviewed  Nails Reviewed    Diet Order:   Diet Order             Diet regular Room service appropriate? Yes; Fluid consistency: Thin  Diet effective now                   EDUCATION NEEDS:   No education needs have been identified at this time  Skin:  Skin Assessment: Reviewed RN Assessment  Last BM:  04/17/24  Height:   Ht Readings from Last 1 Encounters:  04/17/24 5' 2 (1.575 m)    Weight:   Wt Readings from Last 1 Encounters:  04/17/24 56.1 kg    Ideal Body Weight:  50 kg  BMI:  Body mass index is 22.61 kg/m.  Estimated Nutritional Needs:   Kcal:  1650-1850  Protein:  85-100 grams  Fluid:  1.6-1.8 L    Margery ORN, RD, LDN, CDCES Registered Dietitian III Certified Diabetes Care and Education Specialist If unable to reach this RD, please use RD Inpatient group chat on secure chat between hours of 8am-4 pm daily

## 2024-04-19 DIAGNOSIS — J189 Pneumonia, unspecified organism: Secondary | ICD-10-CM | POA: Diagnosis not present

## 2024-04-19 DIAGNOSIS — F17290 Nicotine dependence, other tobacco product, uncomplicated: Secondary | ICD-10-CM

## 2024-04-19 DIAGNOSIS — J4531 Mild persistent asthma with (acute) exacerbation: Secondary | ICD-10-CM | POA: Diagnosis not present

## 2024-04-19 LAB — CBC
HCT: 33.2 % — ABNORMAL LOW (ref 36.0–46.0)
Hemoglobin: 10.7 g/dL — ABNORMAL LOW (ref 12.0–15.0)
MCH: 29.9 pg (ref 26.0–34.0)
MCHC: 32.2 g/dL (ref 30.0–36.0)
MCV: 92.7 fL (ref 80.0–100.0)
Platelets: 275 K/uL (ref 150–400)
RBC: 3.58 MIL/uL — ABNORMAL LOW (ref 3.87–5.11)
RDW: 12.5 % (ref 11.5–15.5)
WBC: 14 K/uL — ABNORMAL HIGH (ref 4.0–10.5)
nRBC: 0 % (ref 0.0–0.2)

## 2024-04-19 LAB — LEGIONELLA PNEUMOPHILA SEROGP 1 UR AG: L. pneumophila Serogp 1 Ur Ag: NEGATIVE

## 2024-04-19 MED ORDER — ADULT MULTIVITAMIN W/MINERALS CH: 1.0000 | ORAL_TABLET | Freq: Every day | ORAL | 0 refills | Status: AC

## 2024-04-19 MED ORDER — DM-GUAIFENESIN ER 30-600 MG PO TB12
1.0000 | ORAL_TABLET | Freq: Two times a day (BID) | ORAL | Status: DC
Start: 2024-04-19 — End: 2024-04-19
  Filled 2024-04-19: qty 1

## 2024-04-19 MED ORDER — OXYCODONE-ACETAMINOPHEN 5-325 MG PO TABS: 1.0000 | ORAL_TABLET | Freq: Three times a day (TID) | ORAL | Status: DC | PRN

## 2024-04-19 MED ORDER — FLUTICASONE FUROATE-VILANTEROL 200-25 MCG/ACT IN AEPB
1.0000 | INHALATION_SPRAY | Freq: Every day | RESPIRATORY_TRACT | Status: DC
  Filled 2024-04-19: qty 28

## 2024-04-19 MED ORDER — CEFUROXIME AXETIL 500 MG PO TABS
500.0000 mg | ORAL_TABLET | Freq: Two times a day (BID) | ORAL | Status: DC
  Filled 2024-04-19: qty 1

## 2024-04-19 MED ORDER — MONTELUKAST SODIUM 10 MG PO TABS: 10.0000 mg | ORAL_TABLET | Freq: Every day | ORAL | Status: DC

## 2024-04-19 MED ORDER — MORPHINE SULFATE (PF) 2 MG/ML IV SOLN: 1.0000 mg | Freq: Four times a day (QID) | INTRAVENOUS | Status: DC | PRN

## 2024-04-19 MED ORDER — FLUTICASONE PROPIONATE 50 MCG/ACT NA SUSP
1.0000 | Freq: Every day | NASAL | Status: DC
Start: 2024-04-19 — End: 2024-04-19
  Filled 2024-04-19: qty 16

## 2024-04-19 MED ORDER — DM-GUAIFENESIN ER 30-600 MG PO TB12: 1.0000 | ORAL_TABLET | Freq: Two times a day (BID) | ORAL | 0 refills | Status: AC

## 2024-04-19 MED ORDER — ACETAMINOPHEN 325 MG PO TABS: 650.0000 mg | ORAL_TABLET | Freq: Four times a day (QID) | ORAL | Status: DC | PRN

## 2024-04-19 MED ORDER — OXYCODONE-ACETAMINOPHEN 5-325 MG PO TABS: 1.0000 | ORAL_TABLET | Freq: Three times a day (TID) | ORAL | 0 refills | Status: AC | PRN

## 2024-04-19 MED ORDER — CEFUROXIME AXETIL 500 MG PO TABS: 500.0000 mg | ORAL_TABLET | Freq: Two times a day (BID) | ORAL | 0 refills | Status: AC

## 2024-04-19 NOTE — Discharge Summary (Signed)
 Physician Discharge Summary   Patient: Angela Mckenzie MRN: [id number] DOB: Dec 23, 2000  Admit date:     04/17/2024  Discharge date: 04/19/24  Discharge Physician: Leita Blanch   PCP: Harvey Gaetana CROME, NP   Recommendations at discharge:   follow-up PCP in 1 to 2 week patient advised to establish with pulmonary at [facility]. Referral sent. Abstain from vaping  Discharge Diagnoses: Principal Problem:   Asthma exacerbation Active Problems:   Acute respiratory failure with hypoxia (HCC)   Leukocytosis   Vaping nicotine  dependence, tobacco product  Angela Mckenzie is a 23 y.o. 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  Initially developed cough, rhinorrhea, facial pressure and postnasal drip on Sunday, 04/14/2024.  She went to the urgent care clinic on 04/15/2024 where she was treated with IM Solu-Medrol  and DuoNeb via nebulizer.  She went to see her PCP on 04/17/2024 and she was given IM steroids and was given a prescription for prednisone  and Z-Pak which she has not started yet.   Chest x-ray was unremarkable   CT chest 1. Patchy bilateral airspace opacification mostly peripheral in location. Findings are likely due to multifocal pneumonia which may be of viral or bacterial etiology. Recommend follow-up chest CT 4-6 weeks after appropriate treatment to document resolution.   2. Predominant linear but somewhat nodular density along the upper right paramediastinal region anteriorly extending to the pleural likely atelectasis or part of the presumed infectious process as described above. Recommend attention to this area on the above described 4-6 week follow-up CT.  Multifocal pneumonia: Continue empiric IV ceftriaxone  and azithromycin -- change to oral antibiotic. --RSV, influenza A and  B and SARS-CoV-2 test were negative. --Respiratory viral panel was negative. -- Sputum culture negative --remains afebrile.  -- White count trending down   Acute asthma exacerbation: with history of moderate persistent asthma -- continue po steroids and bronchodilators. --She admits that she does not use Dulera  on a regular basis at home.  She has been advised to use Dulera  as prescribed twice a day as maintenance therapy and use albuterol  as needed as a rescue inhaler. -- Patient advised to establish care with [facility] pulmonary. Referral sent   Acute hypoxic respiratory failure: Improved.  She has been weaned off of room air.   Sinus tachycardia: This is likely due to acute illness and expected to improve.    Regular vaping: Patient has been advised to avoid vaping since this may cause vaping-associated lung injury.  Overall hemodynamically stable. No respiratory distress. Discharge plan discussed with patient she is agreeable.      Pain control - [place]  Controlled Substance Reporting System database was reviewed. and patient was instructed, not to drive, operate heavy machinery, perform activities at heights, swimming or participation in water activities or provide baby-sitting services while on Pain, Sleep and Anxiety Medications; until their outpatient Physician has advised to do so again. Also recommended to not to take more than prescribed Pain, Sleep and Anxiety Medications.  Consultants: none Procedures performed: none Disposition: Home Diet recommendation:  Regular diet DISCHARGE MEDICATION: Allergies as of 04/19/2024       Reactions   Justicia Adhatoda (malabar Nut Tree) [justicia Adhatoda] Anaphylaxis   All tree nuts and peanuts All tree nuts and peanuts   Peanut Oil Anaphylaxis   Cat Hair Extract    Remeron [mirtazapine]  Urine retention        Medication List     STOP taking these medications    nitrofurantoin (macrocrystal-monohydrate) 100 MG  capsule Commonly known as: MACROBID       TAKE these medications    albuterol  108 (90 Base) MCG/ACT inhaler Commonly known as: VENTOLIN  HFA Inhale 2 puffs into the lungs every 4 (four) hours as needed for wheezing or shortness of breath. What changed: Another medication with the same name was removed. Continue taking this medication, and follow the directions you see here.   azithromycin  250 MG tablet Commonly known as: ZITHROMAX  Take 250 mg by mouth once. Took 1 dose yesterday   cefUROXime 500 MG tablet Commonly known as: CEFTIN Take 1 tablet (500 mg total) by mouth 2 (two) times daily with a meal for 5 days.   cetirizine 10 MG tablet Commonly known as: ZYRTEC Take 10 mg by mouth daily.   dextromethorphan-guaiFENesin  30-600 MG 12hr tablet Commonly known as: MUCINEX  DM Take 1 tablet by mouth 2 (two) times daily for 7 days.   EPINEPHrine  0.3 mg/0.3 mL Soaj injection Commonly known as: EPI-PEN Inject 0.3 mg into the muscle as needed for anaphylaxis.   fluticasone 50 MCG/ACT nasal spray Commonly known as: FLONASE Place 1 spray into both nostrils 2 (two) times daily.   ipratropium-albuterol  0.5-2.5 (3) MG/3ML Soln Commonly known as: DUONEB Take 3 mLs by nebulization every 6 (six) hours as needed.   lisdexamfetamine 40 MG capsule Commonly known as: VYVANSE Take 40 mg by mouth in the morning.   mometasone -formoterol  200-5 MCG/ACT Aero Commonly known as: DULERA  Inhale 2 puffs into the lungs 2 (two) times daily.   montelukast  10 MG tablet Commonly known as: SINGULAIR  Take 1 tablet (10 mg total) by mouth at bedtime.   multivitamin with minerals Tabs tablet Take 1 tablet by mouth daily. Start taking on: April 20, 2024   nicotine  14 mg/24hr patch Commonly known as: NICODERM CQ  - dosed in mg/24 hours Place 14 mg onto the skin daily.   norgestimate-ethinyl estradiol 0.25-35 MG-MCG tablet Commonly known as: ORTHO-CYCLEN Take 1 tablet by mouth daily.   ondansetron   4 MG disintegrating tablet Commonly known as: Zofran  ODT Take 1 tablet (4 mg total) by mouth every 8 (eight) hours as needed for nausea or vomiting.   oxyCODONE-acetaminophen  5-325 MG tablet Commonly known as: PERCOCET/ROXICET Take 1 tablet by mouth every 8 (eight) hours as needed for moderate pain (pain score 4-6) or severe pain (pain score 7-10).   predniSONE  10 MG tablet Commonly known as: DELTASONE  Take 4 tablets (40 mg total) by mouth daily for 5 days.   propranolol 10 MG tablet Commonly known as: INDERAL Take 10 mg by mouth 2 (two) times daily.   valACYclovir 500 MG tablet Commonly known as: VALTREX Take 500 mg by mouth daily.        Follow-up Information     Kasa, Kurian, MD. Schedule an appointment as soon as possible for a visit in 1 week(s).   Specialties: Pulmonary Disease, Cardiology Why: asthma flare  Hospital Follow-Up: Office Angela call the patient. Contact information: [address] [place] [address] [phone]         Harvey Gaetana CROME, NP. Schedule an appointment as soon as possible for a visit in 1 week(s).   Specialty: Family Medicine Contact information: [address] [place] [address] [phone]                Discharge Exam:  Filed Weights   04/17/24 0836  Weight: 56.1 kg   Alert and oriented times three respiratory clear to auscultation, no respiratory distress. Able to talk sentence without short winded cardiovascular both heart sounds normal no murmur neuro- grossly intact  Condition at discharge: fair  The results of significant diagnostics from this hospitalization (including imaging, microbiology, ancillary and laboratory) are listed below for reference.   Imaging Studies: CT CHEST WO CONTRAST Result Date: 04/17/2024 CLINICAL DATA:  Asthma exacerbation since yesterday with upper respiratory infection 3 days. Sore chest and upper back. Difficulty breathing. EXAM: CT CHEST WITHOUT CONTRAST  TECHNIQUE: Multidetector CT imaging of the chest was performed following the standard protocol without IV contrast. RADIATION DOSE REDUCTION: This exam was performed according to the departmental dose-optimization program which includes automated exposure control, adjustment of the mA and/or kV according to patient size and/or use of iterative reconstruction technique. COMPARISON:  CT abdomen 10/26/2021 FINDINGS: Cardiovascular: Heart is normal size. Thoracic aorta is normal in caliber. Pulmonary arterial system and remaining vascular structures are unremarkable on this noncontrast exam. Mediastinum/Nodes: No mediastinal or hilar adenopathy. Remaining mediastinal structures are unremarkable. Lungs/Pleura: Lungs are adequately inflated demonstrate patchy bilateral airspace opacification which are mostly peripheral/subpleural location and ground-glass to moderate in density. These changes are more prominent over the mid to upper lungs linear but somewhat nodular area of opacification extending vertically along the anterior right paramediastinal region to the pleura likely atelectasis or part of the more diffuse patchy airspace process described previously. No effusion. Airways are normal. Upper Abdomen: No acute findings over the visualized upper abdominal structures. Musculoskeletal: No focal abnormality. IMPRESSION: 1. Patchy bilateral airspace opacification mostly peripheral in location. Findings are likely due to multifocal pneumonia which may be of viral or bacterial etiology. Recommend follow-up chest CT 4-6 weeks after appropriate treatment to document resolution. 2. Predominant linear but somewhat nodular density along the upper right paramediastinal region anteriorly extending to the pleural likely atelectasis or part of the presumed infectious process as described above. Recommend attention to this area on the above described 4-6 week follow-up CT. Electronically Signed   By: Toribio Agreste M.D.   On:  04/17/2024 12:30   DG Chest Portable 1 View Result Date: 04/17/2024 EXAM: 1 VIEW(S) XRAY OF THE CHEST 04/17/2024 08:58:00 AM COMPARISON: 06/22/2023 CLINICAL HISTORY: cough, hypoxia. Cough, hypoxia FINDINGS: LUNGS AND PLEURA: No focal pulmonary opacity. No pulmonary edema. No pleural effusion. No pneumothorax. HEART AND MEDIASTINUM: No acute abnormality of the cardiac and mediastinal silhouettes. BONES AND SOFT TISSUES: No acute osseous abnormality. IMPRESSION: 1. No acute process. Electronically signed by: Evalene Coho MD 04/17/2024 09:17 AM EDT RP Workstation: [id number]    Microbiology: Results for orders placed or performed during the hospital encounter of 04/17/24  Culture, blood (Routine X 2) w Reflex to ID Panel     Status: None (Preliminary result)   Collection Time: 04/17/24  6:08 PM   Specimen: BLOOD  Result Value Ref Range Status   Specimen Description BLOOD RIGHT ANTECUBITAL  Final   Special Requests   Final    BOTTLES DRAWN AEROBIC AND ANAEROBIC Blood Culture adequate volume   Culture   Final    NO GROWTH 2 DAYS Performed at [facility], [address] [place], [place] [address]    Report Status PENDING  Incomplete  Culture, blood (Routine X 2) w Reflex to ID Panel     Status: None (Preliminary result)   Collection Time: 04/17/24  6:09 PM   Specimen: BLOOD  Result Value Ref Range Status   Specimen Description BLOOD BLOOD LEFT HAND  Final   Special Requests   Final    BOTTLES DRAWN AEROBIC AND ANAEROBIC Blood Culture adequate volume   Culture   Final    NO GROWTH 2 DAYS Performed at [facility], [address], [place], [place] [address]    Report Status PENDING  Incomplete  Respiratory (~20 pathogens) panel by PCR     Status: None   Collection Time: 04/18/24  3:10 AM   Specimen: Nasopharyngeal Swab; Respiratory  Result Value Ref Range Status   Adenovirus NOT DETECTED NOT DETECTED Final   Coronavirus 229E NOT DETECTED NOT DETECTED Final     Comment: (NOTE) The Coronavirus on the Respiratory Panel, DOES NOT test for the novel  Coronavirus (2019 nCoV)    Coronavirus HKU1 NOT DETECTED NOT DETECTED Final   Coronavirus NL63 NOT DETECTED NOT DETECTED Final   Coronavirus OC43 NOT DETECTED NOT DETECTED Final   Metapneumovirus NOT DETECTED NOT DETECTED Final   Rhinovirus / Enterovirus NOT DETECTED NOT DETECTED Final   Influenza A NOT DETECTED NOT DETECTED Final   Influenza B NOT DETECTED NOT DETECTED Final   Parainfluenza Virus 1 NOT DETECTED NOT DETECTED Final   Parainfluenza Virus 2 NOT DETECTED NOT DETECTED Final   Parainfluenza Virus 3 NOT DETECTED NOT DETECTED Final   Parainfluenza Virus 4 NOT DETECTED NOT DETECTED Final   Respiratory Syncytial Virus NOT DETECTED NOT DETECTED Final   Bordetella pertussis NOT DETECTED NOT DETECTED Final   Bordetella Parapertussis NOT DETECTED NOT DETECTED Final   Chlamydophila pneumoniae NOT DETECTED NOT DETECTED Final   Mycoplasma pneumoniae NOT DETECTED NOT DETECTED Final    Comment: Performed at [facility], [address] [place], [place] [address]  Expectorated Sputum Assessment w Gram Stain, Rflx to Resp Cult     Status: None   Collection Time: 04/18/24  4:50 AM   Specimen: Expectorated Sputum  Result Value Ref Range Status   Specimen Description EXPECTORATED SPUTUM  Final   Special Requests NONE  Final   Sputum evaluation   Final    THIS SPECIMEN IS ACCEPTABLE FOR SPUTUM CULTURE Performed at [facility], [address] [place], [place] [address]    Report Status 04/18/2024 FINAL  Final  Culture, Respiratory w Gram Stain     Status: None (Preliminary result)   Collection Time: 04/18/24  4:50 AM  Result Value Ref Range Status   Specimen Description   Final    EXPECTORATED SPUTUM Performed at [facility], [address] [place], [place] [address]    Special Requests   Final    NONE Reflexed from [id number] Performed at [facility], [address] [place], [place] [address]    Gram Stain NO WBC SEEN NO ORGANISMS SEEN   Final   Culture   Final    CULTURE REINCUBATED FOR BETTER GROWTH Performed at [facility], [address] [place], [place] [address]    Report Status PENDING  Incomplete    Labs: CBC: Recent Labs  Lab 04/17/24 0843 04/18/24 0421 04/19/24 0337  WBC 18.9* 18.1* 14.0*  NEUTROABS 15.4*  --   --   HGB 12.9 11.2* 10.7*  HCT 39.3 34.6* 33.2*  MCV 91.8 92.8 92.7  PLT 331 292 275   Basic Metabolic Panel: Recent Labs  Lab 04/17/24 0843 04/18/24 0421  NA 141 140  K 3.8 4.3  CL 111 109  CO2  20* 22  GLUCOSE 100* 111*  BUN 21* 15  CREATININE 0.52 0.37*  CALCIUM 8.9 8.5*   Liver Function Tests: Recent Labs  Lab 04/18/24 0421  AST 14*  ALT 17  ALKPHOS 35*  BILITOT 0.3  PROT 6.5  ALBUMIN 3.2*    Discharge time spent: greater than 30 minutes.  Signed: Leita Blanch, MD [facility] 04/19/2024

## 2024-04-21 LAB — CULTURE, RESPIRATORY W GRAM STAIN
Culture: NORMAL
Gram Stain: NONE SEEN

## 2024-04-22 LAB — CULTURE, BLOOD (ROUTINE X 2)
Culture: NO GROWTH
Culture: NO GROWTH
Special Requests: ADEQUATE
Special Requests: ADEQUATE

## 2024-05-01 ENCOUNTER — Encounter: Payer: Self-pay | Admitting: Internal Medicine

## 2024-05-01 ENCOUNTER — Ambulatory Visit: Admitting: Internal Medicine

## 2024-05-01 VITALS — BP 110/70 | HR 117 | Temp 98.5°F | Ht 66.0 in | Wt 126.2 lb

## 2024-05-01 DIAGNOSIS — J4540 Moderate persistent asthma, uncomplicated: Secondary | ICD-10-CM | POA: Diagnosis not present

## 2024-05-01 MED ORDER — NICOTINE 21 MG/24HR TD PT24: 21.0000 mg | MEDICATED_PATCH | TRANSDERMAL | 1 refills | Status: AC

## 2024-05-01 NOTE — Progress Notes (Signed)
 Name: Angela Mckenzie MRN: [id number] DOB: 2000-10-25      STUDIES:  09/15/2020 CXR independently reviewed by Me today No acute pneumonia No effusions   CHIEF COMPLAINT:  Assessment of asthma   HISTORY OF PRESENT ILLNESS: March 29,2022 Admitted for severe ASTHMA attack who presented to the emergency room with a severe acute asthma attack  with dyspnea as well as cough productive of yellowish-white sputum and wheezing    Has had 4-5 severe asthma attacks  This is when she purchased a cat for a pet and she is allergic to everything cats and dogs. +exposure to cats and dogs  +smoking and vaping exposures Patient is at high risk for recurrent exacerbation   Recent admission to the hospital CT of her chest shows multifocal ground glass opacifications likely related to upper respiratory infection with atypical pneumonia Patient was given prednisone  and antibiotics her symptoms have completely resolved patient does have some residual mucus production No  evidence of active infection at this  CT of the chest October 2025 reviewed in detail Patchy bilateral airspace opacification mostly peripheral in location. Findings are likely due to multifocal pneumonia which may be of viral or bacterial etiology Diagnosis of Multifocal pneumonia:  --RSV, influenza A and B and SARS-CoV-2 test were negative. --Respiratory viral panel was negative. -- Sputum culture negative     PAST MEDICAL HISTORY :   has a past medical history of Asthma.  has a past surgical history that includes Breast lumpectomy and Wisdom tooth extraction. Prior to Admission medications   Medication Sig Start Date End Date Taking? Authorizing Provider  albuterol  (PROVENTIL ) (2.5 MG/3ML) 0.083% nebulizer solution Take 3 mLs (2.5 mg total) by nebulization every 6 (six) hours as needed for wheezing or shortness of breath. 09/07/20   Arlander Charleston, MD  albuterol  (VENTOLIN  HFA) 108 (90 Base) MCG/ACT inhaler Inhale 2 puffs into  the lungs every 6 (six) hours as needed.    [provider]  cetirizine (ZYRTEC) 10 MG tablet Take 10 mg by mouth daily.    [provider]  EPINEPHrine  0.3 mg/0.3 mL IJ SOAJ injection PLEASE SEE ATTACHED FOR DETAILED DIRECTIONS Patient not taking: Reported on 09/15/2020 01/31/18   [provider]  mometasone -formoterol  (DULERA ) 200-5 MCG/ACT AERO Inhale 2 puffs into the lungs 2 (two) times daily. 09/16/20   Drusilla Sabas RAMAN, MD  montelukast  (SINGULAIR ) 10 MG tablet Take 1 tablet (10 mg total) by mouth at bedtime. 09/16/20   Drusilla Sabas RAMAN, MD  norgestimate-ethinyl estradiol (ORTHO-CYCLEN) 0.25-35 MG-MCG tablet Take 1 tablet by mouth daily. 08/19/20   [provider]  ondansetron  (ZOFRAN  ODT) 4 MG disintegrating tablet Take 1 tablet (4 mg total) by mouth every 8 (eight) hours as needed for nausea or vomiting. Patient not taking: Reported on 09/15/2020 06/30/18   Emil Share, DO  predniSONE  (DELTASONE ) 10 MG tablet Prednisone  40 mg po daily x 1 day then Prednisone  30 mg po daily x 1 day then Prednisone  20 mg po daily x 1 day then Prednisone  10 mg daily x 1 day then stop... 09/17/20   Drusilla Sabas RAMAN, MD  valACYclovir (VALTREX) 500 MG tablet Take 500 mg by mouth daily. Patient not taking: Reported on 09/15/2020 12/05/17   [provider]   Allergies  Allergen Reactions   Justicia Adhatoda (Malabar Nut Tree) [Justicia Adhatoda] Anaphylaxis    All tree nuts and peanuts All tree nuts and peanuts    Peanut Oil Anaphylaxis   Cat Hair Extract    Remeron [  Mirtazapine]     Urine retention    FAMILY HISTORY:  family history is not on file. SOCIAL HISTORY:  reports that she quit smoking about 4 years ago. Her smoking use included cigarettes. She has never used smokeless tobacco. She reports that she does not currently use alcohol. She reports that she does not currently use drugs after having used the following drugs: Marijuana.   BP 110/70   Pulse (!) 117 Comment:  known to be tachycardic  Temp 98.5 F (36.9 C)   Ht 5' 6 (1.676 m)   Wt 126 lb 3.2 oz (57.2 kg)   LMP 04/08/2024   SpO2 96%   BMI 20.37 kg/m    Review of Systems: Gen:  Denies  fever, sweats, chills weight loss  HEENT: Denies blurred vision, double vision, ear pain, eye pain, hearing loss, nose bleeds, sore throat Cardiac:  No dizziness, chest pain or heaviness, chest tightness,edema, No JVD Resp:   No cough, -sputum production, -shortness of breath,-wheezing, -hemoptysis,  Gi: Denies swallowing difficulty, stomach pain, nausea or vomiting, diarrhea, constipation, bowel incontinence Gu:  Denies bladder incontinence, burning urine Ext:   Denies Joint pain, stiffness or swelling Skin: Denies  skin rash, easy bruising or bleeding or hives Endoc:  Denies polyuria, polydipsia , polyphagia or weight change Psych:   Denies depression, insomnia or hallucinations  Other:  All other systems negative   Physical Examination:   General Appearance: No distress  EYES PERRLA, EOM intact.   NECK Supple, No JVD Pulmonary: CTA B/L normal breath sounds, No wheezing.  CardiovascularNormal S1,S2.  No m/r/g.  Regular Rate and Rhythm Abdomen: Benign, Soft, non-tender. Skin:   warm, no rashes, no ecchymosis  Extremities: normal, no cyanosis, clubbing. Neuro:without focal findings,  speech normal  PSYCHIATRIC: Mood, affect within normal limits. SKIN:normal, warm to touch, pale, warm Capillary refill delayed  Pulses present bilaterally  ALL OTHER ROS ARE NEGATIVE      ASSESSMENT AND PLAN SYNOPSIS 23 year old pleasant white female seen today for follow-up assessment for moderate persistent asthma in the setting of ongoing vaping along with history of marijuana use with exposure to cats and dogs   I explained to the patient that she will persistently have symptoms as long as she is exposed I have explained to her that she needs to make better choices in her lifestyle Patient is going to quit  vaping  I have discussed her medication regimen Continue Dulera  2 puffs twice daily Rinse mouth after use Continue to use albuterol  and nebulizers as needed No indication for antibiotics or prednisone  at this time Avoid Allergens and Irritants Avoid secondhand smoke Avoid SICK contacts Recommend  Masking  when appropriate Recommend Keep up-to-date with vaccinations  Patient is at high risk for recurrent infections and asthma attacks Patient is a high risk for getting COPD if she continues to vape  All these findings were explained to the patient in detail   MEDICATION ADJUSTMENTS/LABS AND TESTS ORDERED: PLEASE STOP VAPING AVOID CATS START NICOTINE  PATCH 21 mg CONTINUE DULERA  2 puffs twice daily  ALBUTEROL  AS NEEDED   CURRENT MEDICATIONS REVIEWED AT LENGTH WITH PATIENT TODAY   Patient  satisfied with Plan of action and management. All questions answered   Follow up 6 months   I spent a total of 62 minutes dedicated to the care of this patient on the date of this encounter to include pre-visit review of records, face-to-face time with the patient discussing conditions above, post visit ordering of testing, clinical documentation  with the electronic health record, making appropriate referrals as documented, and communicating necessary information to the patient's healthcare team.    The Patient requires high complexity decision making for assessment and support, frequent evaluation and titration of therapies, application of advanced monitoring technologies and extensive interpretation of multiple databases.  Patient satisfied with Plan of action and management. All questions answered    Nickolas Alm Cellar, M.D.  [facility]  Medical Director [facility]

## 2024-05-01 NOTE — Patient Instructions (Signed)
 Please stop vaping Continue Dulera  2 puffs in the morning and 2 puffs at night Please rinse mouth after use Continue to use albuterol  and DuoNebs as needed At this point I do not think you need a CAT scan of your chest for follow-up Start nicotine  patches 21 mg every 24 hours  Avoid Allergens and Irritants Avoid secondhand smoke Avoid SICK contacts Recommend  Masking  when appropriate Recommend Keep up-to-date with vaccinations

## 2024-05-15 ENCOUNTER — Encounter: Admitting: Registered"

## 2024-05-28 ENCOUNTER — Ambulatory Visit: Admitting: Internal Medicine

## 2024-05-28 ENCOUNTER — Encounter: Admitting: Registered"

## 2024-06-05 ENCOUNTER — Encounter: Payer: Self-pay | Admitting: Registered"

## 2024-06-05 ENCOUNTER — Encounter: Admitting: Registered"

## 2024-06-05 DIAGNOSIS — F5082 Avoidant/restrictive food intake disorder: Secondary | ICD-10-CM | POA: Insufficient documentation

## 2024-06-05 NOTE — Progress Notes (Signed)
 Appointment start time: 2:15  Appointment end time: 2:51  Patient was seen on 06/05/2024 for nutrition counseling pertaining to disordered eating  Primary care provider: Gaetana Haddock, NP Therapist: none  ROI: N/A Any other medical team members: Nickolas Cellar, MD (pulmonologist)   Assessment  Pt arrives stating she was in the hospital due to pneumonia and referral was placed for nutrition. States she was seeing [facility] for disordered eating, ARFID, about a year ago and stopped going due to distance and cost. States she wants to expand her food intake. States she has a very limited palate and has a fear of certain food groups and unsure why. States she doesn't eat any vegetables other than potatoes. States she doesn't eat whole fruits. States she grew up not having to eat her vegetables and has become accustomed to that.   States she used to not eat sauce but has expanded to eating sauce now. States she will eat pizza sauce as long as its not too much on the pizza. States on non-workdays she eats a lot of snacks throughout the day instead of meals.   States she has gained 20 lbs in 2 months likely due to being on steroids and increased food intake during that time. 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   Growth Metrics: Goal weight range based on growth chart data: 120-130's Goal rate of weight gain:  0.5-1.0 lb/week  Eating history: Length of time:  Previous treatments: [employer] Goals for RD meetings: improve constipation, dizziness/lightheadedness, headaches, heart racing, cold intolerance  Weight history:  Highest weight: 160   Lowest weight: 120 Most consistent weight:   What would you like to weigh: How has weight changed in the past year: weight gain  Medical Information:  Changes in hair, skin, nails since ED started: no Chewing/swallowing  difficulties: no Reflux or heartburn: occasionally Trouble with teeth: no LMP without the use of hormones: N/A  Weight at that point: N/A Effect of exercise on menses: N/A   Effect of hormones on menses: continuously takes the pill and does not have monthly menstrual periods Constipation, diarrhea: sometimes constipation, has BM every 1-2 days; sometimes it is hard to poop Dizziness/lightheadedness: sometimes when standing too quickly Headaches/body aches: yes, after work Heart racing/chest pain: yes Mood: some days lacks motivation Sleep: sleeps 6 hrs/night Focus/concentration: no Cold intolerance: yes Vision changes: no  Mental health diagnosis: ARFID   Dietary assessment: A typical day consists of 1-3 meals and 0-3 snacks  Safe foods include: most proteins, dairy, fast food, burgers, chicken tenders, mac and cheese, fries, super blended fruit, fruit juice, fruit flavored things, flavored applesauce, potatoes, avocado, most grains, pasta, rice  Avoided foods include: white milk, vegetables, whole fruit  Allergies: all nuts 24 hour recall:  B S L: coffee S: Monster energy drink D (8 pm): [employer]-bbq sandwich (no slaw) + fries + cheese bites + Cheerwine S: 1/2 pretzel combos + M&M milkshake  Beverages: coffee (8 oz), Monster (16 oz), Cheerwine (32 oz), Milkshake (16 oz), water (32 oz); 104 oz   What Methods Do You Use To Control Your Weight (Compensatory behaviors)?           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  As evidenced by dietary recall.  Intervention/Goals: Pt was educated and counseled on eating to nourish the body, signs/symptoms  of not being adequately nourished, ways to increase nourishment, and meal planning. Discussed importance of consistency with meals on a day-to-day basis. Discussed importance of having a mental health provider throughout this process. Pt agreed with goals listed. Goals: - Aim for 3 meals a day that include at least 3 food groups.   Meal plan:    3 meals    0-3 snacks  Monitoring and Evaluation: Patient will follow up in 4 weeks.

## 2024-06-05 NOTE — Patient Instructions (Addendum)
-   Aim for 3 meals a day that include at least 3 food groups.

## 2024-07-09 ENCOUNTER — Encounter: Admitting: Registered"
